# Patient Record
Sex: Female | Born: 1935 | Race: Black or African American | Hispanic: No | Smoking: Never smoker
Health system: Southern US, Community
[De-identification: ages and names within clinical notes are randomized; demographics above are authoritative.]

## PROBLEM LIST (undated history)

## (undated) DIAGNOSIS — I10 Essential (primary) hypertension: Secondary | ICD-10-CM

## (undated) DIAGNOSIS — E215 Disorder of parathyroid gland, unspecified: Secondary | ICD-10-CM

## (undated) DIAGNOSIS — I639 Cerebral infarction, unspecified: Secondary | ICD-10-CM

## (undated) DIAGNOSIS — N289 Disorder of kidney and ureter, unspecified: Secondary | ICD-10-CM

## (undated) DIAGNOSIS — C801 Malignant (primary) neoplasm, unspecified: Secondary | ICD-10-CM

## (undated) HISTORY — DX: Cerebral infarction, unspecified: I63.9

## (undated) HISTORY — DX: Disorder of parathyroid gland, unspecified: E21.5

## (undated) HISTORY — DX: Essential (primary) hypertension: I10

## (undated) HISTORY — PX: CAROTID ARTERY ANGIOPLASTY: SHX1300

---

## 1998-12-21 ENCOUNTER — Ambulatory Visit (HOSPITAL_COMMUNITY): Admission: RE | Admit: 1998-12-21 | Discharge: 1998-12-21 | Payer: Self-pay | Admitting: Ophthalmology

## 2001-09-14 ENCOUNTER — Encounter: Payer: Self-pay | Admitting: Internal Medicine

## 2001-09-14 ENCOUNTER — Encounter: Admission: RE | Admit: 2001-09-14 | Discharge: 2001-09-14 | Payer: Self-pay | Admitting: Internal Medicine

## 2002-08-08 ENCOUNTER — Encounter: Payer: Self-pay | Admitting: Obstetrics and Gynecology

## 2002-08-08 ENCOUNTER — Encounter: Admission: RE | Admit: 2002-08-08 | Discharge: 2002-08-08 | Payer: Self-pay | Admitting: Obstetrics and Gynecology

## 2002-10-07 ENCOUNTER — Encounter: Admission: RE | Admit: 2002-10-07 | Discharge: 2002-10-07 | Payer: Self-pay | Admitting: Internal Medicine

## 2002-10-07 ENCOUNTER — Encounter: Payer: Self-pay | Admitting: Internal Medicine

## 2003-10-12 ENCOUNTER — Encounter: Admission: RE | Admit: 2003-10-12 | Discharge: 2003-10-12 | Payer: Self-pay | Admitting: Internal Medicine

## 2004-11-28 ENCOUNTER — Encounter: Admission: RE | Admit: 2004-11-28 | Discharge: 2004-11-28 | Payer: Self-pay | Admitting: Internal Medicine

## 2005-12-04 ENCOUNTER — Encounter: Admission: RE | Admit: 2005-12-04 | Discharge: 2005-12-04 | Payer: Self-pay | Admitting: Internal Medicine

## 2005-12-09 ENCOUNTER — Ambulatory Visit: Payer: Self-pay | Admitting: Gastroenterology

## 2006-01-05 ENCOUNTER — Ambulatory Visit: Payer: Self-pay | Admitting: Gastroenterology

## 2006-01-16 ENCOUNTER — Ambulatory Visit: Payer: Self-pay | Admitting: Gastroenterology

## 2006-11-10 HISTORY — PX: BREAST LUMPECTOMY: SHX2

## 2006-12-14 ENCOUNTER — Encounter: Admission: RE | Admit: 2006-12-14 | Discharge: 2006-12-14 | Payer: Self-pay | Admitting: Internal Medicine

## 2006-12-30 ENCOUNTER — Encounter: Admission: RE | Admit: 2006-12-30 | Discharge: 2006-12-30 | Payer: Self-pay | Admitting: Internal Medicine

## 2007-01-11 ENCOUNTER — Encounter (INDEPENDENT_AMBULATORY_CARE_PROVIDER_SITE_OTHER): Payer: Self-pay | Admitting: Diagnostic Radiology

## 2007-01-11 ENCOUNTER — Encounter: Admission: RE | Admit: 2007-01-11 | Discharge: 2007-01-11 | Payer: Self-pay | Admitting: Internal Medicine

## 2007-01-11 ENCOUNTER — Encounter (INDEPENDENT_AMBULATORY_CARE_PROVIDER_SITE_OTHER): Payer: Self-pay | Admitting: *Deleted

## 2007-01-27 ENCOUNTER — Encounter: Admission: RE | Admit: 2007-01-27 | Discharge: 2007-01-27 | Payer: Self-pay | Admitting: Internal Medicine

## 2007-02-04 ENCOUNTER — Encounter: Admission: RE | Admit: 2007-02-04 | Discharge: 2007-02-04 | Payer: Self-pay | Admitting: General Surgery

## 2007-02-04 ENCOUNTER — Ambulatory Visit (HOSPITAL_COMMUNITY): Admission: RE | Admit: 2007-02-04 | Discharge: 2007-02-04 | Payer: Self-pay | Admitting: General Surgery

## 2007-03-15 ENCOUNTER — Encounter: Admission: RE | Admit: 2007-03-15 | Discharge: 2007-03-15 | Payer: Self-pay | Admitting: General Surgery

## 2007-03-15 ENCOUNTER — Encounter (INDEPENDENT_AMBULATORY_CARE_PROVIDER_SITE_OTHER): Payer: Self-pay | Admitting: Specialist

## 2007-03-15 ENCOUNTER — Ambulatory Visit (HOSPITAL_BASED_OUTPATIENT_CLINIC_OR_DEPARTMENT_OTHER): Admission: RE | Admit: 2007-03-15 | Discharge: 2007-03-15 | Payer: Self-pay | Admitting: General Surgery

## 2007-03-24 ENCOUNTER — Ambulatory Visit: Payer: Self-pay | Admitting: Oncology

## 2007-03-31 LAB — COMPREHENSIVE METABOLIC PANEL
ALT: 14 U/L (ref 0–35)
AST: 18 U/L (ref 0–37)
Albumin: 4.1 g/dL (ref 3.5–5.2)
Alkaline Phosphatase: 70 U/L (ref 39–117)
BUN: 18 mg/dL (ref 6–23)
CO2: 27 mEq/L (ref 19–32)
Calcium: 11 mg/dL — ABNORMAL HIGH (ref 8.4–10.5)
Chloride: 108 mEq/L (ref 96–112)
Creatinine, Ser: 0.97 mg/dL (ref 0.40–1.20)
Glucose, Bld: 100 mg/dL — ABNORMAL HIGH (ref 70–99)
Potassium: 3.8 mEq/L (ref 3.5–5.3)
Sodium: 145 mEq/L (ref 135–145)
Total Bilirubin: 0.4 mg/dL (ref 0.3–1.2)
Total Protein: 7.5 g/dL (ref 6.0–8.3)

## 2007-03-31 LAB — CBC WITH DIFFERENTIAL/PLATELET
BASO%: 0.3 % (ref 0.0–2.0)
EOS%: 1 % (ref 0.0–7.0)
HCT: 35.6 % (ref 34.8–46.6)
LYMPH%: 29.4 % (ref 14.0–48.0)
MCH: 29.7 pg (ref 26.0–34.0)
MCHC: 34.4 g/dL (ref 32.0–36.0)
MCV: 86.3 fL (ref 81.0–101.0)
MONO%: 7.3 % (ref 0.0–13.0)
NEUT%: 62 % (ref 39.6–76.8)
Platelets: 225 10*3/uL (ref 145–400)
lymph#: 1.7 10*3/uL (ref 0.9–3.3)

## 2007-06-27 ENCOUNTER — Ambulatory Visit: Payer: Self-pay | Admitting: Oncology

## 2007-07-01 LAB — CBC WITH DIFFERENTIAL/PLATELET
Basophils Absolute: 0 10*3/uL (ref 0.0–0.1)
Eosinophils Absolute: 0.1 10*3/uL (ref 0.0–0.5)
HGB: 12.2 g/dL (ref 11.6–15.9)
LYMPH%: 35.2 % (ref 14.0–48.0)
MCV: 86.5 fL (ref 81.0–101.0)
MONO#: 0.4 10*3/uL (ref 0.1–0.9)
MONO%: 9.2 % (ref 0.0–13.0)
NEUT#: 2.5 10*3/uL (ref 1.5–6.5)
Platelets: 222 10*3/uL (ref 145–400)
WBC: 4.6 10*3/uL (ref 3.9–10.0)

## 2007-10-28 ENCOUNTER — Ambulatory Visit: Payer: Self-pay | Admitting: Oncology

## 2007-11-01 LAB — CBC WITH DIFFERENTIAL/PLATELET
Eosinophils Absolute: 0.1 10*3/uL (ref 0.0–0.5)
HCT: 38.8 % (ref 34.8–46.6)
LYMPH%: 33.2 % (ref 14.0–48.0)
MCHC: 34.5 g/dL (ref 32.0–36.0)
MCV: 87.8 fL (ref 81.0–101.0)
MONO#: 0.4 10*3/uL (ref 0.1–0.9)
MONO%: 8.1 % (ref 0.0–13.0)
NEUT#: 2.8 10*3/uL (ref 1.5–6.5)
NEUT%: 57.1 % (ref 39.6–76.8)
Platelets: 218 10*3/uL (ref 145–400)
RBC: 4.42 10*6/uL (ref 3.70–5.32)

## 2007-11-01 LAB — COMPREHENSIVE METABOLIC PANEL
AST: 15 U/L (ref 0–37)
Albumin: 4.5 g/dL (ref 3.5–5.2)
BUN: 20 mg/dL (ref 6–23)
CO2: 24 mEq/L (ref 19–32)
Calcium: 11.4 mg/dL — ABNORMAL HIGH (ref 8.4–10.5)
Chloride: 106 mEq/L (ref 96–112)
Potassium: 3.7 mEq/L (ref 3.5–5.3)

## 2007-11-01 LAB — CANCER ANTIGEN 27.29: CA 27.29: 14 U/mL (ref 0–39)

## 2008-03-13 ENCOUNTER — Encounter: Admission: RE | Admit: 2008-03-13 | Discharge: 2008-03-13 | Payer: Self-pay | Admitting: Oncology

## 2008-07-20 ENCOUNTER — Ambulatory Visit: Payer: Self-pay | Admitting: Oncology

## 2008-08-01 LAB — CBC WITH DIFFERENTIAL/PLATELET
BASO%: 0.6 % (ref 0.0–2.0)
Basophils Absolute: 0 10*3/uL (ref 0.0–0.1)
EOS%: 1.2 % (ref 0.0–7.0)
Eosinophils Absolute: 0.1 10*3/uL (ref 0.0–0.5)
HCT: 38.2 % (ref 34.8–46.6)
HGB: 13 g/dL (ref 11.6–15.9)
LYMPH%: 36.6 % (ref 14.0–48.0)
MCH: 29.6 pg (ref 26.0–34.0)
MCHC: 34 g/dL (ref 32.0–36.0)
MCV: 87.2 fL (ref 81.0–101.0)
MONO#: 0.4 10*3/uL (ref 0.1–0.9)
MONO%: 8 % (ref 0.0–13.0)
NEUT#: 2.8 10*3/uL (ref 1.5–6.5)
NEUT%: 53.7 % (ref 39.6–76.8)
Platelets: 229 10*3/uL (ref 145–400)
RBC: 4.38 10*6/uL (ref 3.70–5.32)
RDW: 12.9 % (ref 11.3–14.5)
WBC: 5.1 10*3/uL (ref 3.9–10.0)
lymph#: 1.9 10*3/uL (ref 0.9–3.3)

## 2008-08-01 LAB — COMPREHENSIVE METABOLIC PANEL
ALT: 14 U/L (ref 0–35)
CO2: 26 mEq/L (ref 19–32)
Chloride: 106 mEq/L (ref 96–112)
Sodium: 145 mEq/L (ref 135–145)
Total Bilirubin: 0.6 mg/dL (ref 0.3–1.2)
Total Protein: 7.5 g/dL (ref 6.0–8.3)

## 2008-08-01 LAB — CANCER ANTIGEN 27.29: CA 27.29: 19 U/mL (ref 0–39)

## 2009-01-25 ENCOUNTER — Ambulatory Visit: Payer: Self-pay | Admitting: Oncology

## 2009-01-29 LAB — CBC WITH DIFFERENTIAL/PLATELET
BASO%: 0.3 % (ref 0.0–2.0)
Basophils Absolute: 0 10*3/uL (ref 0.0–0.1)
EOS%: 0.7 % (ref 0.0–7.0)
HGB: 12.9 g/dL (ref 11.6–15.9)
MCH: 28.2 pg (ref 25.1–34.0)
MONO%: 6.9 % (ref 0.0–14.0)
RBC: 4.58 10*6/uL (ref 3.70–5.45)
RDW: 13.8 % (ref 11.2–14.5)
lymph#: 1.4 10*3/uL (ref 0.9–3.3)

## 2009-01-30 LAB — CANCER ANTIGEN 27.29: CA 27.29: 23 U/mL (ref 0–39)

## 2009-01-30 LAB — COMPREHENSIVE METABOLIC PANEL
BUN: 17 mg/dL (ref 6–23)
CO2: 27 mEq/L (ref 19–32)
Calcium: 11.7 mg/dL — ABNORMAL HIGH (ref 8.4–10.5)
Chloride: 104 mEq/L (ref 96–112)
Creatinine, Ser: 1.11 mg/dL (ref 0.40–1.20)

## 2009-01-30 LAB — LACTATE DEHYDROGENASE: LDH: 150 U/L (ref 94–250)

## 2009-01-30 LAB — VITAMIN D 25 HYDROXY (VIT D DEFICIENCY, FRACTURES): Vit D, 25-Hydroxy: 57 ng/mL (ref 30–89)

## 2009-03-14 ENCOUNTER — Encounter: Admission: RE | Admit: 2009-03-14 | Discharge: 2009-03-14 | Payer: Self-pay | Admitting: Oncology

## 2009-04-10 HISTORY — PX: PARATHYROIDECTOMY: SHX19

## 2010-02-11 ENCOUNTER — Ambulatory Visit: Payer: Self-pay | Admitting: Oncology

## 2010-02-13 LAB — COMPREHENSIVE METABOLIC PANEL
ALT: 24 U/L (ref 0–35)
AST: 23 U/L (ref 0–37)
Albumin: 3.5 g/dL (ref 3.5–5.2)
BUN: 19 mg/dL (ref 6–23)
CO2: 30 mEq/L (ref 19–32)
Calcium: 11.3 mg/dL — ABNORMAL HIGH (ref 8.4–10.5)
Chloride: 105 mEq/L (ref 96–112)
Creatinine, Ser: 1.06 mg/dL (ref 0.40–1.20)
Potassium: 3.6 mEq/L (ref 3.5–5.3)
Total Bilirubin: 0.9 mg/dL (ref 0.3–1.2)

## 2010-02-13 LAB — CBC WITH DIFFERENTIAL/PLATELET
HGB: 11.2 g/dL — ABNORMAL LOW (ref 11.6–15.9)
MCV: 89.3 fL (ref 79.5–101.0)
MONO%: 9.2 % (ref 0.0–14.0)
NEUT#: 4.7 10*3/uL (ref 1.5–6.5)
RBC: 3.77 10*6/uL (ref 3.70–5.45)

## 2010-03-18 ENCOUNTER — Encounter: Admission: RE | Admit: 2010-03-18 | Discharge: 2010-03-18 | Payer: Self-pay | Admitting: Oncology

## 2010-09-02 ENCOUNTER — Ambulatory Visit (HOSPITAL_COMMUNITY): Admission: RE | Admit: 2010-09-02 | Discharge: 2010-09-02 | Payer: Self-pay | Admitting: Internal Medicine

## 2010-12-01 ENCOUNTER — Encounter: Payer: Self-pay | Admitting: Oncology

## 2010-12-01 ENCOUNTER — Encounter: Payer: Self-pay | Admitting: General Surgery

## 2010-12-01 ENCOUNTER — Encounter: Payer: Self-pay | Admitting: Internal Medicine

## 2011-03-13 ENCOUNTER — Other Ambulatory Visit (HOSPITAL_COMMUNITY): Payer: Self-pay | Admitting: Surgery

## 2011-03-13 ENCOUNTER — Other Ambulatory Visit: Payer: Self-pay | Admitting: Surgery

## 2011-03-13 ENCOUNTER — Ambulatory Visit (HOSPITAL_COMMUNITY)
Admission: RE | Admit: 2011-03-13 | Discharge: 2011-03-13 | Disposition: A | Discharge status: Home / Self Care | Payer: Medicare Other | Source: Ambulatory Visit | Attending: Surgery | Admitting: Surgery

## 2011-03-13 ENCOUNTER — Encounter (HOSPITAL_COMMUNITY): Payer: Medicare Other

## 2011-03-13 DIAGNOSIS — Z01810 Encounter for preprocedural cardiovascular examination: Secondary | ICD-10-CM | POA: Insufficient documentation

## 2011-03-13 DIAGNOSIS — Z01811 Encounter for preprocedural respiratory examination: Secondary | ICD-10-CM

## 2011-03-13 DIAGNOSIS — I10 Essential (primary) hypertension: Secondary | ICD-10-CM | POA: Insufficient documentation

## 2011-03-13 DIAGNOSIS — Z01818 Encounter for other preprocedural examination: Secondary | ICD-10-CM | POA: Insufficient documentation

## 2011-03-13 DIAGNOSIS — Z01812 Encounter for preprocedural laboratory examination: Secondary | ICD-10-CM | POA: Insufficient documentation

## 2011-03-13 LAB — URINALYSIS, ROUTINE W REFLEX MICROSCOPIC
Hgb urine dipstick: NEGATIVE
Protein, ur: NEGATIVE mg/dL
Specific Gravity, Urine: 1.015 (ref 1.005–1.030)

## 2011-03-13 LAB — DIFFERENTIAL
Basophils Absolute: 0 10*3/uL (ref 0.0–0.1)
Basophils Relative: 1 % (ref 0–1)
Lymphocytes Relative: 33 % (ref 12–46)
Neutrophils Relative %: 57 % (ref 43–77)

## 2011-03-13 LAB — SURGICAL PCR SCREEN

## 2011-03-13 LAB — BASIC METABOLIC PANEL
BUN: 17 mg/dL (ref 6–23)
CO2: 29 mEq/L (ref 19–32)
Calcium: 11.5 mg/dL — ABNORMAL HIGH (ref 8.4–10.5)
Chloride: 106 mEq/L (ref 96–112)
GFR calc Af Amer: >60 mL/min (ref >60)
Glucose, Bld: 99 mg/dL (ref 70–99)
Potassium: 3.9 mEq/L (ref 3.5–5.1)
Sodium: 142 mEq/L (ref 135–145)

## 2011-03-13 LAB — CBC
HCT: 35.8 % — ABNORMAL LOW (ref 36.0–46.0)
MCH: 28.8 pg (ref 26.0–34.0)
WBC: 6.1 10*3/uL (ref 4.0–10.5)

## 2011-03-16 LAB — MRSA CULTURE

## 2011-03-18 ENCOUNTER — Other Ambulatory Visit: Payer: Self-pay | Admitting: Surgery

## 2011-03-18 ENCOUNTER — Observation Stay (HOSPITAL_COMMUNITY)
Admission: RE | Admit: 2011-03-18 | Discharge: 2011-03-19 | Disposition: A | Discharge status: Home / Self Care | Payer: Medicare Other | Source: Ambulatory Visit | Attending: Surgery | Admitting: Surgery

## 2011-03-18 DIAGNOSIS — Z853 Personal history of malignant neoplasm of breast: Secondary | ICD-10-CM | POA: Insufficient documentation

## 2011-03-18 DIAGNOSIS — Z01812 Encounter for preprocedural laboratory examination: Secondary | ICD-10-CM | POA: Insufficient documentation

## 2011-03-18 DIAGNOSIS — E210 Primary hyperparathyroidism: Principal | ICD-10-CM | POA: Insufficient documentation

## 2011-03-18 DIAGNOSIS — E785 Hyperlipidemia, unspecified: Secondary | ICD-10-CM | POA: Insufficient documentation

## 2011-03-18 DIAGNOSIS — M949 Disorder of cartilage, unspecified: Secondary | ICD-10-CM | POA: Insufficient documentation

## 2011-03-18 DIAGNOSIS — M899 Disorder of bone, unspecified: Secondary | ICD-10-CM | POA: Insufficient documentation

## 2011-03-18 DIAGNOSIS — I10 Essential (primary) hypertension: Secondary | ICD-10-CM | POA: Insufficient documentation

## 2011-03-18 DIAGNOSIS — Z79899 Other long term (current) drug therapy: Secondary | ICD-10-CM | POA: Insufficient documentation

## 2011-03-25 NOTE — Op Note (Signed)
[name], [name]                  ACCOUNT NO.:  [id number]   MEDICAL RECORD NO.:  [id number]          PATIENT TYPE:  AMB   LOCATION:  DSC                          FACILITY:  [facility]   PHYSICIAN:  Ollen Gross. Vernell Morgans, M.D. DATE OF BIRTH:  1931-08-19   DATE OF PROCEDURE:  03/15/2007  DATE OF DISCHARGE:                               OPERATIVE REPORT   PREOPERATIVE DIAGNOSIS:  Left breast cancer.   POSTOPERATIVE DIAGNOSIS:  Left breast cancer.   PROCEDURES:  Left breast lumpectomy, needle-localized lumpectomy and  sentinel node biopsy with injection of blue dye.   SURGEON:  Ollen Gross. Vernell Morgans, M.D.   ANESTHESIA:  General via LMA.   PROCEDURE:  After informed consent was obtained, the patient was brought  to the operating room, placed in supine position on the operating table.  After adequate induction of general anesthesia, the patient's left  breast, chest and axillary area were prepped with Betadine and draped in  the usual sterile manner.  Earlier in the day the patient had undergone  a wire localization procedure and the wire was entering the left breast  laterally in the superior portion.  The patient also underwent a nuclear  medicine injection prior to this surgery. Two (2) cc of methylene blue  and 3 cc of injectable saline were then injected in a subareolar fashion  and the breast tissue was massaged for several minutes.  Using a  NeoProbe 2000 a single hot spot was identified in the left axilla.  A  small transverse incision was made in the axilla with a 15 blade knife.  This incision was carried down through the skin and subcutaneous tissue  sharply with the electrocautery until the axilla was entered.  Using the  NeoProbe to direct the dissection, blunt dissection was carried out in  the axilla with a hemostat until 2 areas of blue lymph node were  identified.  Each of these areas were hot using the NeoProbe.  Each of  these two areas were dissected free using a combination  of sharp  dissection with the electrocautery.  The lymphatics at each lymph node  were then clamped with hemostats, divided and ligated with 3-0 Vicryl  ties.  No other hot spots were identified.  The ex vivo counts on  sentinel node #1 were approximately 1000 and on #2 were approximately  320.  These were sent as sentinel node #1 and 2.  The rest of the axilla  was examined.  No other hot spots were identified.  No other palpable  nodes were apparent.  The wound was hemostatic.  The deep layer of the  incision was then closed with interrupted 3-0 Vicryl stitches and the  skin was closed with a running 4-0 Monocryl subcuticular stitch.  Attention was then turned to the left breast.  A curvilinear incision  was made in the upper outer aspect of the left breast to incorporate the  wire.  This incision was made with a 15 blade knife.  This incision was  carried down through the skin and subcutaneous tissue into the breast  tissue sharply with the electrocautery.  A circular piece of breast  tissue was then excised around the path of the wire.  This was done  sharply with the electrocautery.  Once the specimen was removed it was  oriented with the short stitch being superior and the long stitch being  lateral.  The dissection was carried from the skin down to the chest  wall.  Touch preps on the lymph nodes were negative.  Touch preps on the  margins of the specimen were negative.  The wound was then irrigated  with copious amounts of saline.  Hemostasis was achieved using the Bovie  electrocautery.  Both wounds were then infiltrated with 0.25% Marcaine.  The deep layer of the breast wound was then closed with interrupted 3-0  Vicryl stitches and the skin was closed with a running 4-0 Monocryl  subcuticular stitch.  Benzoin, Steri-Strips and sterile dressings were  applied.  The patient tolerated the procedure well.  At the end of the  case, all needle, sponge and instrument counts were  correct.  The  patient was then awakened and taken to recovery in stable condition.      Ollen Gross. Vernell Morgans, M.D.  Electronically Signed     PST/MEDQ  D:  03/15/2007  T:  03/15/2007  Job:  [id number]

## 2011-03-25 NOTE — Op Note (Signed)
[name], [name]                  ACCOUNT NO.:  [id number]  MEDICAL RECORD NO.:  [id number]           PATIENT TYPE:  O  LOCATION:  DAYL                         FACILITY:  [facility]  PHYSICIAN:  Velora Heckler, MD      DATE OF BIRTH:  [birth year]  DATE OF PROCEDURE:  03/18/2011                               OPERATIVE REPORT   PREOPERATIVE DIAGNOSIS:  Primary hyperparathyroidism.  POSTOPERATIVE DIAGNOSIS:  Primary hyperparathyroidism.  PROCEDURE:  Right inferior minimally invasive parathyroidectomy.  SURGEON:  Velora Heckler, MD, FACS  ANESTHESIA:  General.  ESTIMATED BLOOD LOSS:  Minimal.  PREPARATION:  ChloraPrep.  COMPLICATIONS:  None.  INDICATIONS:  The patient is a 77 year old black female from [place], [place].  She is referred by Dr. Guerry Bruin for primary hyperparathyroidism.  The patient has had serum calcium levels running as high as 12.7.  An intact PTH level was normal at 37.  A nuclear medicine parathyroid scan from October 2011, however, showed evidence of activity localizing in the inferior right thyroid lobe consistent with parathyroid adenoma.  The patient is referred to surgery for evaluation. The patient had had a previous neck surgery at [facility] approximately 18 years ago.  Records were not available.  It is presumed this may have been a previous operation for primary hyperparathyroidism. The patient now comes to the operating room for minimally invasive parathyroidectomy.  PROCEDURE IN DETAIL:  Procedure was done in OR #2 at the [facility].  The patient was brought to the operating room, placed in supine position on the operating room table.  Following administration of general anesthesia, the patient is positioned and then prepped and draped in the usual strict aseptic fashion.  After ascertaining that an adequate level of anesthesia had been achieved, a right neck incision is made through the previous scar  using a #15 blade. Dissection was carried through subcutaneous tissues, scar tissue, and the platysma.  Hemostasis was obtained with electrocautery.  Skin flaps were elevated circumferentially and a Weitlaner retractor placed for exposure.  Strap muscles were incised in the midline and reflected to the right.  The inferior pole of the right thyroid lobe was exposed. Muscles were somewhat adherent to the thyroid tissue and are divided with electrocautery.  Right lobe was gently mobilized.  There appears to be a nodular density just posterior and lateral to the inferior right thyroid lobe.  This may well represent abnormally enlarged parathyroid gland.  It is gently dissected out away from the underlying carotid artery.  It was separated from the thyroid parenchyma using the electrocautery.  Venous tributaries were divided between Ligaclips.  The entire density is excised.  It measures approximately 1.5 cm in size. It is submitted fresh to pathology where frozen section biopsy by Dr. Hollice Espy was performed.  Dr. Dierdre Searles confirmed parathyroid tissue which was hyperplastic and consistent with parathyroid adenoma.  She estimated the weight at 1000 mg.  Good hemostasis was obtained throughout the wound.  Surgicel was placed in the operative field.  Strap muscles were reapproximated in the midline with interrupted 3-0 Vicryl sutures.  Platysma was closed with interrupted 3-0 Vicryl sutures.  Skin was anesthetized with local anesthetic.  Skin edges were reapproximated with a running 4-0 Monocryl subcuticular suture.  Wound is washed and dried and Benzoin and Steri- Strips were applied.  Sterile dressings were applied.  The patient is awakened from anesthesia and brought to the recovery room.  The patient tolerated the procedure well.   Velora Heckler, MD, FACS     TMG/MEDQ  D:  03/18/2011  T:  03/18/2011  Job:  [id number]  cc:   Gaspar Garbe, M.D. Fax: [phone]  Electronically Signed  by Darnell Level MD on 03/25/2011 12:06:10 PM

## 2011-03-25 NOTE — Discharge Summary (Signed)
  [name], [name]                  ACCOUNT NO.:  [id number]  MEDICAL RECORD NO.:  [id number]           PATIENT TYPE:  O  LOCATION:  1305                         FACILITY:  [facility]  PHYSICIAN:  Velora Heckler, MD      DATE OF BIRTH:  Apr 30, 1931  DATE OF ADMISSION:  03/18/2011 DATE OF DISCHARGE:  03/19/2011                              DISCHARGE SUMMARY   PREOPERATIVE DIAGNOSIS:  Primary hyperparathyroidism, recurrent.  POSTOPERATIVE DIAGNOSIS:  Primary hyperparathyroidism, recurrent.  BRIEF HISTORY:  The patient is a 77 year old black female from [place], [place].  She was found by Dr. Guerry Bruin to have elevated serum calcium levels.  Recent level was elevated at 12.7. The patient underwent nuclear medicine parathyroid scan in October 2011 which localized an inferior right parathyroid adenoma.  The patient noted to have previous exploration of the neck 18 years ago at [facility].  The patient now returns to the operating room for parathyroidectomy.  HOSPITAL COURSE:  The patient was admitted on May 8th.  She was taken directly to the operating room where she underwent neck exploration and right inferior parathyroidectomy.  Postoperative course was unremarkable.  Serum calcium levels fell from 12.7 to 11.2 to 10.1 on the first postoperative day.  The patient was prepared for discharge.  DISCHARGE PLANNING:  The patient is discharged home today, Mar 19, 2011, in good condition, tolerating a regular diet, and ambulating independently.  DISCHARGE MEDICATIONS:  Vicodin as needed for pain.  The patient will return to my office in 2-3 weeks.  We will check a calcium level prior to her office visit.  FINAL DIAGNOSIS:  Primary hyperparathyroidism, final pathologic results pending at the time of discharge.  Condition at discharge is good.     Velora Heckler, MD     TMG/MEDQ  D:  03/19/2011  T:  03/19/2011  Job:  [id number]  cc:   Gaspar Garbe,  M.D. Fax: [phone]  Electronically Signed by Darnell Level MD on 03/25/2011 12:06:15 PM

## 2011-04-08 ENCOUNTER — Other Ambulatory Visit: Payer: Self-pay | Admitting: Internal Medicine

## 2011-04-08 DIAGNOSIS — Z9889 Other specified postprocedural states: Secondary | ICD-10-CM

## 2011-04-08 DIAGNOSIS — Z853 Personal history of malignant neoplasm of breast: Secondary | ICD-10-CM

## 2011-04-10 ENCOUNTER — Other Ambulatory Visit: Payer: Self-pay | Admitting: Oncology

## 2011-04-10 ENCOUNTER — Encounter (HOSPITAL_BASED_OUTPATIENT_CLINIC_OR_DEPARTMENT_OTHER): Payer: Medicare Other | Admitting: Oncology

## 2011-04-10 DIAGNOSIS — C50419 Malignant neoplasm of upper-outer quadrant of unspecified female breast: Secondary | ICD-10-CM

## 2011-04-10 DIAGNOSIS — Z170 Estrogen receptor positive status [ER+]: Secondary | ICD-10-CM

## 2011-04-10 LAB — CBC WITH DIFFERENTIAL/PLATELET
BASO%: 0.4 % (ref 0.0–2.0)
EOS%: 2.3 % (ref 0.0–7.0)
HCT: 32.5 % — ABNORMAL LOW (ref 34.8–46.6)
MCH: 29.6 pg (ref 25.1–34.0)
MCHC: 33.6 g/dL (ref 31.5–36.0)
MCV: 87.9 fL (ref 79.5–101.0)
MONO%: 9.1 % (ref 0.0–14.0)
NEUT%: 59.8 % (ref 38.4–76.8)
RDW: 13.7 % (ref 11.2–14.5)
lymph#: 1.8 10*3/uL (ref 0.9–3.3)

## 2011-04-11 LAB — PTH, INTACT AND CALCIUM
Calcium, Total (PTH): 9.6 mg/dL (ref 8.4–10.5)
PTH: 15.5 pg/mL (ref 14.0–72.0)

## 2011-04-11 LAB — COMPREHENSIVE METABOLIC PANEL
ALT: 14 U/L (ref 0–35)
Albumin: 3.8 g/dL (ref 3.5–5.2)
CO2: 25 mEq/L (ref 19–32)
Chloride: 107 mEq/L (ref 96–112)
Glucose, Bld: 83 mg/dL (ref 70–99)
Potassium: 3.7 mEq/L (ref 3.5–5.3)
Sodium: 143 mEq/L (ref 135–145)
Total Protein: 7 g/dL (ref 6.0–8.3)

## 2011-04-17 ENCOUNTER — Ambulatory Visit
Admission: RE | Admit: 2011-04-17 | Discharge: 2011-04-17 | Disposition: A | Discharge status: Home / Self Care | Payer: Medicare Other | Source: Ambulatory Visit | Attending: Internal Medicine | Admitting: Internal Medicine

## 2011-04-17 DIAGNOSIS — Z853 Personal history of malignant neoplasm of breast: Secondary | ICD-10-CM

## 2011-04-30 ENCOUNTER — Encounter (INDEPENDENT_AMBULATORY_CARE_PROVIDER_SITE_OTHER): Payer: Self-pay | Admitting: Surgery

## 2011-04-30 DIAGNOSIS — I10 Essential (primary) hypertension: Secondary | ICD-10-CM | POA: Insufficient documentation

## 2011-04-30 DIAGNOSIS — H269 Unspecified cataract: Secondary | ICD-10-CM | POA: Insufficient documentation

## 2011-04-30 DIAGNOSIS — C50919 Malignant neoplasm of unspecified site of unspecified female breast: Secondary | ICD-10-CM

## 2011-04-30 DIAGNOSIS — K069 Disorder of gingiva and edentulous alveolar ridge, unspecified: Secondary | ICD-10-CM | POA: Insufficient documentation

## 2011-04-30 DIAGNOSIS — C50412 Malignant neoplasm of upper-outer quadrant of left female breast: Secondary | ICD-10-CM | POA: Insufficient documentation

## 2011-04-30 DIAGNOSIS — Z973 Presence of spectacles and contact lenses: Secondary | ICD-10-CM

## 2011-05-21 ENCOUNTER — Ambulatory Visit (INDEPENDENT_AMBULATORY_CARE_PROVIDER_SITE_OTHER): Payer: Medicare Other | Admitting: Surgery

## 2011-05-21 ENCOUNTER — Encounter (INDEPENDENT_AMBULATORY_CARE_PROVIDER_SITE_OTHER): Payer: Self-pay | Admitting: Surgery

## 2011-05-21 VITALS — BP 130/64 | HR 66 | Temp 97.0°F | Wt 110.6 lb

## 2011-05-21 DIAGNOSIS — E210 Primary hyperparathyroidism: Secondary | ICD-10-CM | POA: Insufficient documentation

## 2011-05-21 NOTE — Progress Notes (Signed)
HISTORY: The patient returns for followup having undergone minimally invasive parathyroidectomy in May 2012. Final laboratory studies drawn on May 19, 2011 show a normal serum calcium level of 9.3 and a normal intact PTH level of 35.8.   PERTINENT REVIEW OF SYSTEMS: The patient notes increased energy levels. She also notes improvement in bone and joint discomfort.   EXAM: Surgical wound is well healed. No edema. No sign of infection. Good cosmetic result.   IMPRESSION: Status post minimally invasive parathyroidectomy with normalization of serum calcium level and parathyroid hormone level and   PLAN: Patient will return as needed.

## 2011-07-28 ENCOUNTER — Encounter (INDEPENDENT_AMBULATORY_CARE_PROVIDER_SITE_OTHER): Payer: Self-pay | Admitting: General Surgery

## 2011-09-23 ENCOUNTER — Encounter (INDEPENDENT_AMBULATORY_CARE_PROVIDER_SITE_OTHER): Payer: Self-pay | Admitting: General Surgery

## 2011-09-23 ENCOUNTER — Ambulatory Visit (INDEPENDENT_AMBULATORY_CARE_PROVIDER_SITE_OTHER): Payer: Medicaid Other | Admitting: General Surgery

## 2011-09-23 VITALS — BP 102/76 | HR 60 | Temp 98.0°F | Resp 16 | Ht 63.0 in | Wt 115.2 lb

## 2011-09-23 DIAGNOSIS — C50919 Malignant neoplasm of unspecified site of unspecified female breast: Secondary | ICD-10-CM

## 2011-09-23 NOTE — Patient Instructions (Signed)
Continue regular self exams  

## 2011-09-24 ENCOUNTER — Encounter (INDEPENDENT_AMBULATORY_CARE_PROVIDER_SITE_OTHER): Payer: Self-pay | Admitting: General Surgery

## 2011-09-24 NOTE — Progress Notes (Signed)
Subjective:     Patient ID: [name], female   DOB: October 31, 1931, 77 y.o.   MRN: [id number]  HPI The patient is a 77 year old black female who is 4-1/2 years out from a left breast lumpectomy and negative sentinel node biopsy for a T1 A. N0 left breast cancer. She was ER PR positive. Since her last visit she's had no complaints. Her appetite is good and her bowels are working normally.  Review of Systems  Constitutional: Negative.   HENT: Negative.   Eyes: Negative.   Respiratory: Negative.   Cardiovascular: Negative.   Gastrointestinal: Negative.   Genitourinary: Negative.   Musculoskeletal: Negative.   Skin: Negative.   Neurological: Negative.   Hematological: Negative.   Psychiatric/Behavioral: Negative.        Objective:   Physical Exam  Constitutional: She is oriented to person, place, and time. She appears well-developed and well-nourished.  HENT:  Head: Normocephalic and atraumatic.  Eyes: Conjunctivae and EOM are normal. Pupils are equal, round, and reactive to light.  Neck: Normal range of motion. Neck supple.  Cardiovascular: Normal rate, regular rhythm and normal heart sounds.   Pulmonary/Chest: Effort normal and breath sounds normal.       There is no distinct palpable mass in either breast. No axillary supraclavicular or cervical lymphadenopathy.  Abdominal: Soft. Bowel sounds are normal. She exhibits no mass. There is no tenderness.  Musculoskeletal: Normal range of motion.  Lymphadenopathy:    She has no cervical adenopathy.  Neurological: She is alert and oriented to person, place, and time.  Skin: Skin is warm and dry.  Psychiatric: She has a normal mood and affect. Her behavior is normal.       Assessment:     4 1/2 years out from a left breast lumpectomy and negative sentinel node biopsy    Plan:     At this point she seems to be doing well. She has scheduled followup with Dr. Darnelle Catalan the next several months. Her last mammogram showed no evidence  of malignancy. I have encouraged her to continue regular self exams. We will plan to see her back in one year.

## 2012-03-22 ENCOUNTER — Other Ambulatory Visit: Payer: Self-pay | Admitting: Internal Medicine

## 2012-03-22 DIAGNOSIS — Z853 Personal history of malignant neoplasm of breast: Secondary | ICD-10-CM

## 2012-04-06 ENCOUNTER — Other Ambulatory Visit: Payer: Medicare Other | Admitting: Lab

## 2012-04-07 ENCOUNTER — Telehealth: Payer: Self-pay | Admitting: Oncology

## 2012-04-07 NOTE — Telephone Encounter (Signed)
S/w the pt and she is aware of her appt on 04/12/2012 with dr Darnelle Catalan

## 2012-04-12 ENCOUNTER — Ambulatory Visit (HOSPITAL_BASED_OUTPATIENT_CLINIC_OR_DEPARTMENT_OTHER): Payer: Medicare Other | Admitting: Oncology

## 2012-04-12 VITALS — BP 138/73 | HR 75 | Temp 98.5°F | Ht 63.0 in | Wt 118.5 lb

## 2012-04-12 DIAGNOSIS — C50919 Malignant neoplasm of unspecified site of unspecified female breast: Secondary | ICD-10-CM

## 2012-04-12 DIAGNOSIS — Z170 Estrogen receptor positive status [ER+]: Secondary | ICD-10-CM

## 2012-04-12 DIAGNOSIS — C50419 Malignant neoplasm of upper-outer quadrant of unspecified female breast: Secondary | ICD-10-CM

## 2012-04-12 NOTE — Progress Notes (Signed)
ID: [name]   DOB: June 26, 1931  MR#: [id number]  WUJ#:[id number]  HISTORY OF PRESENT ILLNESS: The patient has yearly mammograms routinely and mammography in January of 2006 had been unremarkable.  On 12/14/2006, mammography showed new calcifications in the left breast, and the patient was called back for a diagnostic study with ultrasonography on 12/20/2006.  This showed a small lobular mass with calcifications in the 1 o'clock position within the left breast, which by ultrasound measured up to 5 mm in size.  Accordingly, the patient was called back for ultrasound-guided biopsy 01/11/2007 and this showed ([id number] and [id number]) an invasive ductal carcinoma, which was strongly ER positive at 94%, weakly PR positive at 8%, with a borderline proliferation marker at 19% and negative for HER-2 by the HercepTest at 0.   On 01/27/2007, she underwent bilateral breast MRIs.  This showed multiple foci of enhancement bilaterally, but particularly a 5-mm nodule in the upper outer quadrant of the left breast correlated with the mammographic abnormality otherwise noted.  There was no other dominant nodule or suspicious enhancement noted in either breast.    With this information, the patient proceeded to left needle localization lumpectomy and sentinel lymph node biopsy on 03/15/2007.  The final pathology there ([id number]) showed a 3-mm invasive ductal carcinoma, which was grade 2, with no evidence of lymphovascular invasion.  Margins were negative, although close at 1 mm, both for the invasive and the noninvasive components.  Both of the sentinel lymph nodes were clear.  Her subsequent history is as detailed below.  INTERVAL HISTORY: [name] returns today for routine followup of her breast cancer. The interval history is unremarkable. Family is doing "fine", she reports no side effects from her anastrozole, and when asked what her worse problem is she tells me she does not have a "worst problem"  REVIEW OF  SYSTEMS: She denies unusual headaches, visual changes, chest pain or pressure, cough, phlegm production, pleurisy, change in bowel or bladder habits, pain, fever, bleeding, rash, or unexplained fatigue or weight loss. A detailed review of systems today was negative  PAST MEDICAL HISTORY: Past Medical History  Diagnosis Date  . Hypertension   . Cataract   . Parathyroid disease     Clarify with patient. No detail indicated on history form.  . Thyroid disease     PAST SURGICAL HISTORY: Past Surgical History  Procedure Date  . Breast lumpectomy     Confirm type with patient. Not indicated on medical history form dated 12/26/10.  . Parathyroidectomy June 2010    FAMILY HISTORY Family History  Problem Relation Age of Onset  . Stroke Mother   . Stroke Father   The patient's father died at the age of 76 from a stroke.  The patient's mother died at the age of 39 from a stroke.  The patient had 9 siblings--3 brothers and 6 sisters.  There is no history of breast cancer in the family.  Two sisters died from "cancer in the stomach", by which the patient actually means the abdomen.  It is not possible to ascertain whether this was ovarian cancer or some other cancer metastatic to the abdominal area.    GYNECOLOGIC HISTORY: The patient is GX P11, first pregnancy age 49, last pregnancy age 70.  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  She is widowed--her husband died in 04/22/1991 from multiple complications of diabetes and high blood pressure.  A grandson, Mardee Postin, lives behind the house and is frequently with her. Mostly, the patient lives by herself.  She does drive.  She is a member of [employer].   ADVANCED DIRECTIVES:  HEALTH MAINTENANCE: History  Substance Use Topics  . Smoking status: Former  Smoker    Types: Cigarettes  . Smokeless tobacco: Never Used   Comment: "When using the bathroom"  . Alcohol Use: Yes     Glass of wine on holidays.     Colonoscopy:  PAP:  Bone density:  Lipid panel:  No Known Allergies  Current Outpatient Prescriptions  Medication Sig Dispense Refill  . anastrozole (ARIMIDEX) 1 MG tablet Take 1 mg by mouth daily.        . Cholecalciferol ([employer] VIT D 5000 HIGH-POTENCY PO) Take by mouth.        . felodipine (PLENDIL) 2.5 MG 24 hr tablet Take 10 mg by mouth daily. Very medication name, dosage and frequency. Not indicated on history form.      . ferrous fumarate (FERRO-SEQUELS) 50 MG CR tablet Take 65 mg by mouth daily. Verify type, dosage and frequency with patient. Not indicated on history form.      . ibandronate (BONIVA) 150 MG tablet Take 150 mg by mouth every 30 (thirty) days. Take in the morning with a full glass of water, on an empty stomach, and do not take anything else by mouth or lie down for the next 30 min.       . metoprolol (LOPRESSOR) 50 MG tablet Take 25 mg by mouth 2 (two) times daily. Verify dosage/frequency with patient. Not indicated on history form.      . potassium chloride (KLOR-CON) 8 MEQ tablet Take 20 mEq by mouth daily. Verify type, dosage and frequency. Not indicated on history form.        OBJECTIVE: Elderly African American woman in no acute distress Filed Vitals:   04/12/12 0949  BP: 138/73  Pulse: 75  Temp: 98.5 F (36.9 C)     Body mass index is 20.99 kg/(m^2).    ECOG FS: 1  Sclerae unicteric Oropharynx clear No peripheral adenopathy Lungs no rales or rhonchi Heart regular rate and rhythm Abd benign MSK no focal spinal tenderness, no peripheral edema Neuro: nonfocal Breasts: Right breast is unremarkable. The left breast is status post lumpectomy. There is no evidence of local recurrence  LAB RESULTS: Lab Results  Component Value Date   WBC 6.3 04/10/2011   WBC 6.3 04/10/2011   NEUTROABS 3.7 04/10/2011    NEUTROABS 3.7 04/10/2011   HGB 10.9* 04/10/2011   HGB 10.9* 04/10/2011   HCT 32.5* 04/10/2011   HCT 32.5* 04/10/2011   MCV 87.9 04/10/2011   MCV 87.9 04/10/2011   PLT 252 04/10/2011   PLT 252 04/10/2011      Chemistry      Component Value Date/Time   NA 143 04/10/2011 1042   K 3.7 04/10/2011 1042   CL 107 04/10/2011 1042   CO2 25 04/10/2011 1042   BUN 16 04/10/2011 1042   CREATININE 1.00 04/10/2011 1042      Component Value Date/Time   CALCIUM 9.6 04/10/2011 1042   CALCIUM 9.6 04/10/2011 1042   ALKPHOS 66 04/10/2011 1042   AST 15 04/10/2011 1042   ALT 14 04/10/2011 1042   BILITOT 0.4 04/10/2011 1042       Lab Results  Component Value Date   LABCA2 23 01/29/2009    No components found with this basename: [id number]    No results found for this basename: INR:1;PROTIME:1 in the last 168 hours  Urinalysis    Component Value Date/Time   COLORURINE YELLOW 03/13/2011 1230   APPEARANCEUR CLEAR 03/13/2011 1230   LABSPEC 1.015 03/13/2011 1230   PHURINE 6.5 03/13/2011 1230   GLUCOSEU NEGATIVE 03/13/2011 1230   HGBUR NEGATIVE 03/13/2011 1230   BILIRUBINUR NEGATIVE 03/13/2011 1230   KETONESUR NEGATIVE 03/13/2011 1230   PROTEINUR NEGATIVE 03/13/2011 1230   UROBILINOGEN 0.2 03/13/2011 1230   NITRITE NEGATIVE 03/13/2011 1230   LEUKOCYTESUR NEGATIVE MICROSCOPIC NOT DONE ON URINES WITH NEGATIVE PROTEIN, BLOOD, LEUKOCYTES, NITRITE, OR GLUCOSE <1000 mg/dL. 03/13/2011 1230    STUDIES: No new results found. Mammography is a ready scheduled for later this month  ASSESSMENT: A 78 year old [place] woman   (1) status post left lumpectomy and sentinel lymph node dissection May 2008 for a T1a N0 grade 2 estrogen and progesterone receptor positive HER-2 negative invasive ductal carcinoma, did not receive  radiation, on Arimidex since June 2008 with excellent tolerance  (2) removal of a parathyroid adenoma under Dr. Gerrit Friends, Mar 18, 2011 (pathology [phone])  PLAN: She has completed 5 years of an aromatase inhibitor. At  this point I feel comfortable releasing her back to her primary care physician. All she will need in terms of breast cancer followup is yearly mammography and yearly physician breast exam. She understands that we keep her records for 10 years and any problems or concerns that may develop in the future I will be glad to troubleshoot with her or her other doctors. As of now however no further appointments are being made for her here.   Saphira Lahmann C    04/12/2012

## 2012-04-19 ENCOUNTER — Ambulatory Visit
Admission: RE | Admit: 2012-04-19 | Discharge: 2012-04-19 | Disposition: A | Discharge status: Home / Self Care | Payer: Medicare Other | Source: Ambulatory Visit | Attending: Internal Medicine | Admitting: Internal Medicine

## 2012-04-19 DIAGNOSIS — Z853 Personal history of malignant neoplasm of breast: Secondary | ICD-10-CM

## 2013-03-30 ENCOUNTER — Other Ambulatory Visit: Payer: Self-pay | Admitting: Internal Medicine

## 2013-03-30 DIAGNOSIS — Z853 Personal history of malignant neoplasm of breast: Secondary | ICD-10-CM

## 2013-04-29 ENCOUNTER — Ambulatory Visit
Admission: RE | Admit: 2013-04-29 | Discharge: 2013-04-29 | Disposition: A | Discharge status: Home / Self Care | Payer: Medicare Other | Source: Ambulatory Visit | Attending: Internal Medicine | Admitting: Internal Medicine

## 2013-04-29 DIAGNOSIS — Z853 Personal history of malignant neoplasm of breast: Secondary | ICD-10-CM

## 2014-03-29 ENCOUNTER — Other Ambulatory Visit: Payer: Self-pay | Admitting: Internal Medicine

## 2014-03-29 DIAGNOSIS — Z853 Personal history of malignant neoplasm of breast: Secondary | ICD-10-CM

## 2014-05-01 ENCOUNTER — Ambulatory Visit
Admission: RE | Admit: 2014-05-01 | Discharge: 2014-05-01 | Disposition: A | Discharge status: Home / Self Care | Payer: Medicare Other | Source: Ambulatory Visit | Attending: Internal Medicine | Admitting: Internal Medicine

## 2014-05-01 DIAGNOSIS — Z853 Personal history of malignant neoplasm of breast: Secondary | ICD-10-CM

## 2014-11-05 ENCOUNTER — Emergency Department (INDEPENDENT_AMBULATORY_CARE_PROVIDER_SITE_OTHER)
Admission: EM | Admit: 2014-11-05 | Discharge: 2014-11-05 | Disposition: A | Discharge status: Home / Self Care | Payer: Medicare Other | Source: Home / Self Care | Attending: Family Medicine | Admitting: Family Medicine

## 2014-11-05 ENCOUNTER — Encounter (HOSPITAL_COMMUNITY): Payer: Self-pay | Admitting: *Deleted

## 2014-11-05 DIAGNOSIS — L03115 Cellulitis of right lower limb: Secondary | ICD-10-CM

## 2014-11-05 HISTORY — DX: Malignant (primary) neoplasm, unspecified: C80.1

## 2014-11-05 MED ORDER — DOXYCYCLINE HYCLATE 100 MG PO CAPS: 100.0000 mg | ORAL_CAPSULE | Freq: Two times a day (BID) | ORAL | Status: DC

## 2014-11-05 NOTE — Discharge Instructions (Signed)

## 2014-11-05 NOTE — ED Provider Notes (Signed)
CSN: [id number]     Arrival date & time 11/05/14  1648 History   First MD Initiated Contact with Patient 11/05/14 1708     Chief Complaint  Patient presents with  . Abscess   (Consider location/radiation/quality/duration/timing/severity/associated sxs/prior Treatment) HPI Comments: Reports she developed a "sore" at her right lateral lower leg about 3 weeks ago that she has been treating at home with local wound care and hydrogen peroxide. States this sore has nearly healed. States that about one week ago she developed an additional similar sore just proximal to original lesion. She has also performed wound care to this area, however, one of her children noticed lesion and asked that she have area evaluated by a medical provider.  States she has developed some mild redness and swelling around area over past 24-48 hours without associated drainage, fever, chills.    Patient is a 80 y.o. female presenting with abscess. The history is provided by the patient.  Abscess   Past Medical History  Diagnosis Date  . Hypertension   . Cataract   . Parathyroid disease     Clarify with patient. No detail indicated on history form.  . Thyroid disease   . Cancer     breast   Past Surgical History  Procedure Laterality Date  . Parathyroidectomy  June 2010  . Breast lumpectomy  2008    Confirm type with patient. Not indicated on medical history form dated 12/26/10.   Family History  Problem Relation Age of Onset  . Stroke Mother   . Hypertension Mother   . Stroke Father    History  Substance Use Topics  . Smoking status: Passive Smoke Exposure - Never Smoker  . Smokeless tobacco: Never Used     Comment: "When using the bathroom"  . Alcohol Use: Yes     Comment: Glass of wine on holidays.   OB History    No data available     Review of Systems  All other systems reviewed and are negative.   Allergies  Review of patient's allergies indicates no known allergies.  Home Medications     Prior to Admission medications   Medication Sig Start Date End Date Taking? Authorizing Provider  Cholecalciferol ([employer] VIT D 5000 HIGH-POTENCY PO) Take by mouth.     Yes Historical Provider, MD  felodipine (PLENDIL) 2.5 MG 24 hr tablet Take 10 mg by mouth daily. Very medication name, dosage and frequency. Not indicated on history form.   Yes Historical Provider, MD  ferrous sulfate 325 (65 FE) MG tablet Take 325 mg by mouth daily with breakfast.   Yes Historical Provider, MD  ibandronate (BONIVA) 150 MG tablet Take 150 mg by mouth every 30 (thirty) days. Take in the morning with a full glass of water, on an empty stomach, and do not take anything else by mouth or lie down for the next 30 min.    Yes Historical Provider, MD  losartan (COZAAR) 25 MG tablet Take 25 mg by mouth daily.   Yes Historical Provider, MD  metoprolol (LOPRESSOR) 50 MG tablet Take 25 mg by mouth 2 (two) times daily. Verify dosage/frequency with patient. Not indicated on history form.   Yes Historical Provider, MD  potassium chloride SA (K-DUR,KLOR-CON) 20 MEQ tablet Take 20 mEq by mouth 2 (two) times daily.   Yes Historical Provider, MD  rosuvastatin (CRESTOR) 20 MG tablet Take 20 mg by mouth daily. Cholesterol med- not sure of name or dose.   Yes Historical Provider, MD  anastrozole (ARIMIDEX) 1 MG tablet Take 1 mg by mouth daily.      Historical Provider, MD  doxycycline (VIBRAMYCIN) 100 MG capsule Take 1 capsule (100 mg total) by mouth 2 (two) times daily. 11/05/14   Audelia Hives Rosabel Sermeno, PA  ferrous fumarate (FERRO-SEQUELS) 50 MG CR tablet Take 65 mg by mouth daily. Verify type, dosage and frequency with patient. Not indicated on history form.    Historical Provider, MD  potassium chloride (KLOR-CON) 8 MEQ tablet Take 20 mEq by mouth daily. Verify type, dosage and frequency. Not indicated on history form.    Historical Provider, MD   BP 193/88 mmHg  Pulse 81  Temp(Src) 98.2 F (36.8 C) (Oral)  Resp 16  SpO2  99% Physical Exam  Constitutional: She is oriented to person, place, and time. She appears well-developed and well-nourished. No distress.  HENT:  Head: Normocephalic and atraumatic.  Eyes: Conjunctivae are normal.  Cardiovascular: Normal rate.   Pulmonary/Chest: Effort normal.  Musculoskeletal: Normal range of motion.       Legs: Neurological: She is alert and oriented to person, place, and time.  Skin: Skin is warm and dry. There is erythema.  Psychiatric: She has a normal mood and affect. Her behavior is normal.  Nursing note and vitals reviewed.   ED Course  Procedures (including critical care time) Labs Review Labs Reviewed - No data to display  Imaging Review No results found.   MDM   1. Cellulitis of right lower leg   No clinical indication of abscess. Continue local wound care, elevation and begin doxycycline as prescribed. Advised 2-3 day follow up with her PCP for wound re-check.     Lutricia Feil, [place] 11/05/14 [phone]

## 2014-11-05 NOTE — ED Notes (Addendum)
Abscess R lower leg 3 weeks ago. It healed up with H202 and vaseline.  Another came up last week about 2-3 " above the other one.  Started as a small fluid filled bump. No hx. of MRSA.

## 2015-03-06 ENCOUNTER — Inpatient Hospital Stay (HOSPITAL_COMMUNITY)
Admission: EM | Admit: 2015-03-06 | Discharge: 2015-03-09 | DRG: 065 | Disposition: A | Discharge status: Home / Self Care | Payer: Medicare Other | Attending: Internal Medicine | Admitting: Internal Medicine

## 2015-03-06 ENCOUNTER — Emergency Department (HOSPITAL_COMMUNITY): Payer: Medicare Other

## 2015-03-06 ENCOUNTER — Encounter (HOSPITAL_COMMUNITY): Payer: Self-pay | Admitting: Emergency Medicine

## 2015-03-06 DIAGNOSIS — C50412 Malignant neoplasm of upper-outer quadrant of left female breast: Secondary | ICD-10-CM | POA: Diagnosis present

## 2015-03-06 DIAGNOSIS — Z7722 Contact with and (suspected) exposure to environmental tobacco smoke (acute) (chronic): Secondary | ICD-10-CM | POA: Diagnosis present

## 2015-03-06 DIAGNOSIS — C50919 Malignant neoplasm of unspecified site of unspecified female breast: Secondary | ICD-10-CM | POA: Diagnosis not present

## 2015-03-06 DIAGNOSIS — G459 Transient cerebral ischemic attack, unspecified: Secondary | ICD-10-CM | POA: Diagnosis not present

## 2015-03-06 DIAGNOSIS — H540 Blindness, both eyes: Secondary | ICD-10-CM | POA: Diagnosis present

## 2015-03-06 DIAGNOSIS — I6389 Other cerebral infarction: Secondary | ICD-10-CM

## 2015-03-06 DIAGNOSIS — I6521 Occlusion and stenosis of right carotid artery: Secondary | ICD-10-CM | POA: Diagnosis not present

## 2015-03-06 DIAGNOSIS — I6529 Occlusion and stenosis of unspecified carotid artery: Secondary | ICD-10-CM | POA: Diagnosis present

## 2015-03-06 DIAGNOSIS — Z7982 Long term (current) use of aspirin: Secondary | ICD-10-CM | POA: Diagnosis not present

## 2015-03-06 DIAGNOSIS — E785 Hyperlipidemia, unspecified: Secondary | ICD-10-CM | POA: Diagnosis present

## 2015-03-06 DIAGNOSIS — Z853 Personal history of malignant neoplasm of breast: Secondary | ICD-10-CM

## 2015-03-06 DIAGNOSIS — I639 Cerebral infarction, unspecified: Secondary | ICD-10-CM | POA: Diagnosis not present

## 2015-03-06 DIAGNOSIS — N183 Chronic kidney disease, stage 3 (moderate): Secondary | ICD-10-CM | POA: Diagnosis present

## 2015-03-06 DIAGNOSIS — I10 Essential (primary) hypertension: Secondary | ICD-10-CM | POA: Diagnosis present

## 2015-03-06 DIAGNOSIS — K219 Gastro-esophageal reflux disease without esophagitis: Secondary | ICD-10-CM | POA: Diagnosis not present

## 2015-03-06 DIAGNOSIS — R531 Weakness: Secondary | ICD-10-CM | POA: Diagnosis present

## 2015-03-06 DIAGNOSIS — I6789 Other cerebrovascular disease: Secondary | ICD-10-CM | POA: Diagnosis not present

## 2015-03-06 DIAGNOSIS — I129 Hypertensive chronic kidney disease with stage 1 through stage 4 chronic kidney disease, or unspecified chronic kidney disease: Secondary | ICD-10-CM | POA: Diagnosis present

## 2015-03-06 DIAGNOSIS — I63411 Cerebral infarction due to embolism of right middle cerebral artery: Principal | ICD-10-CM | POA: Diagnosis present

## 2015-03-06 DIAGNOSIS — G8194 Hemiplegia, unspecified affecting left nondominant side: Secondary | ICD-10-CM | POA: Diagnosis present

## 2015-03-06 DIAGNOSIS — E210 Primary hyperparathyroidism: Secondary | ICD-10-CM | POA: Diagnosis present

## 2015-03-06 DIAGNOSIS — I6523 Occlusion and stenosis of bilateral carotid arteries: Secondary | ICD-10-CM | POA: Insufficient documentation

## 2015-03-06 DIAGNOSIS — I63131 Cerebral infarction due to embolism of right carotid artery: Secondary | ICD-10-CM | POA: Diagnosis not present

## 2015-03-06 HISTORY — DX: Disorder of kidney and ureter, unspecified: N28.9

## 2015-03-06 LAB — COMPREHENSIVE METABOLIC PANEL
ALBUMIN: 3.7 g/dL (ref 3.5–5.2)
ALK PHOS: 89 U/L (ref 39–117)
ALT: 16 U/L (ref 0–35)
ANION GAP: 11 (ref 5–15)
AST: 21 U/L (ref 0–37)
BUN: 18 mg/dL (ref 6–23)
CO2: 24 mmol/L (ref 19–32)
Calcium: 9.4 mg/dL (ref 8.4–10.5)
Chloride: 107 mmol/L (ref 96–112)
Creatinine, Ser: 1.24 mg/dL — ABNORMAL HIGH (ref 0.50–1.10)
GFR calc Af Amer: 46 mL/min — ABNORMAL LOW (ref >90)
GFR, EST NON AFRICAN AMERICAN: 39 mL/min — AB (ref >90)
Glucose, Bld: 108 mg/dL — ABNORMAL HIGH (ref 70–99)
Potassium: 3.8 mmol/L (ref 3.5–5.1)
Sodium: 142 mmol/L (ref 135–145)
Total Bilirubin: 0.5 mg/dL (ref 0.3–1.2)
Total Protein: 7.3 g/dL (ref 6.0–8.3)

## 2015-03-06 LAB — I-STAT CHEM 8, ED
BUN: 19 mg/dL (ref 6–23)
CREATININE: 1.2 mg/dL — AB (ref 0.50–1.10)
Calcium, Ion: 1.16 mmol/L (ref 1.13–1.30)
Chloride: 106 mmol/L (ref 96–112)
Glucose, Bld: 106 mg/dL — ABNORMAL HIGH (ref 70–99)
HEMATOCRIT: 39 % (ref 36.0–46.0)
Hemoglobin: 13.3 g/dL (ref 12.0–15.0)
POTASSIUM: 3.7 mmol/L (ref 3.5–5.1)
Sodium: 143 mmol/L (ref 135–145)
TCO2: 21 mmol/L (ref 0–100)

## 2015-03-06 LAB — TSH: TSH: 1.29 u[IU]/mL (ref 0.350–4.500)

## 2015-03-06 LAB — I-STAT TROPONIN, ED: TROPONIN I, POC: 0 ng/mL (ref 0.00–0.08)

## 2015-03-06 LAB — DIFFERENTIAL
BASOS ABS: 0 10*3/uL (ref 0.0–0.1)
Basophils Relative: 0 % (ref 0–1)
EOS ABS: 0.1 10*3/uL (ref 0.0–0.7)
EOS PCT: 1 % (ref 0–5)
LYMPHS ABS: 2.2 10*3/uL (ref 0.7–4.0)
Lymphocytes Relative: 21 % (ref 12–46)
MONOS PCT: 8 % (ref 3–12)
Monocytes Absolute: 0.8 10*3/uL (ref 0.1–1.0)
NEUTROS ABS: 7.3 10*3/uL (ref 1.7–7.7)
NEUTROS PCT: 70 % (ref 43–77)

## 2015-03-06 LAB — URINALYSIS, ROUTINE W REFLEX MICROSCOPIC
BILIRUBIN URINE: NEGATIVE
Glucose, UA: NEGATIVE mg/dL
HGB URINE DIPSTICK: NEGATIVE
Ketones, ur: NEGATIVE mg/dL
Nitrite: NEGATIVE
PH: 5.5 (ref 5.0–8.0)
Protein, ur: 30 mg/dL — AB
Specific Gravity, Urine: 1.01 (ref 1.005–1.030)
Urobilinogen, UA: 0.2 mg/dL (ref 0.0–1.0)

## 2015-03-06 LAB — CBC
HEMATOCRIT: 37.1 % (ref 36.0–46.0)
Hemoglobin: 12.1 g/dL (ref 12.0–15.0)
MCH: 28.4 pg (ref 26.0–34.0)
MCHC: 32.6 g/dL (ref 30.0–36.0)
MCV: 87.1 fL (ref 78.0–100.0)
Platelets: 241 10*3/uL (ref 150–400)
RBC: 4.26 MIL/uL (ref 3.87–5.11)
RDW: 14.3 % (ref 11.5–15.5)
WBC: 10.4 10*3/uL (ref 4.0–10.5)

## 2015-03-06 LAB — RAPID URINE DRUG SCREEN, HOSP PERFORMED
Amphetamines: NOT DETECTED
Barbiturates: NOT DETECTED
Benzodiazepines: NOT DETECTED
COCAINE: NOT DETECTED
OPIATES: NOT DETECTED
TETRAHYDROCANNABINOL: NOT DETECTED

## 2015-03-06 LAB — PHOSPHORUS: PHOSPHORUS: 3.3 mg/dL (ref 2.3–4.6)

## 2015-03-06 LAB — URINE MICROSCOPIC-ADD ON

## 2015-03-06 LAB — PROTIME-INR
INR: 0.99 (ref 0.00–1.49)
Prothrombin Time: 13.2 seconds (ref 11.6–15.2)

## 2015-03-06 LAB — ETHANOL: Alcohol, Ethyl (B): <5 mg/dL (ref 0–9)

## 2015-03-06 LAB — CBG MONITORING, ED: GLUCOSE-CAPILLARY: 100 mg/dL — AB (ref 70–99)

## 2015-03-06 LAB — APTT: aPTT: 28 seconds (ref 24–37)

## 2015-03-06 MED ORDER — ASPIRIN EC 81 MG PO TBEC
81.0000 mg | DELAYED_RELEASE_TABLET | Freq: Every day | ORAL | Status: DC
  Administered 2015-03-06 – 2015-03-09 (×4): 81 mg via ORAL
  Filled 2015-03-06 (×4): qty 1

## 2015-03-06 MED ORDER — HEPARIN SODIUM (PORCINE) 5000 UNIT/ML IJ SOLN
5000.0000 [IU] | Freq: Three times a day (TID) | INTRAMUSCULAR | Status: DC
  Administered 2015-03-06 – 2015-03-07 (×2): 5000 [IU] via SUBCUTANEOUS
  Filled 2015-03-06 (×2): qty 1

## 2015-03-06 MED ORDER — STROKE: EARLY STAGES OF RECOVERY BOOK: Freq: Once | Status: DC

## 2015-03-06 MED ORDER — STUDY - INVESTIGATIONAL DRUG SIMPLE RECORD
75.0000 mg | Freq: Every day | Status: DC
  Administered 2015-03-07 – 2015-03-09 (×3): 75 mg via ORAL
  Filled 2015-03-06 (×4): qty 0.01

## 2015-03-06 MED ORDER — STUDY - INVESTIGATIONAL DRUG SIMPLE RECORD
600.0000 mg | Status: AC
  Administered 2015-03-06: 600 mg via ORAL
  Filled 2015-03-06: qty 0.12

## 2015-03-06 MED ORDER — METOPROLOL TARTRATE 50 MG PO TABS
50.0000 mg | ORAL_TABLET | Freq: Every day | ORAL | Status: DC
  Administered 2015-03-07 – 2015-03-09 (×3): 50 mg via ORAL
  Filled 2015-03-06 (×3): qty 1

## 2015-03-06 MED ORDER — CHOLECALCIFEROL 10 MCG (400 UNIT) PO TABS
400.0000 [IU] | ORAL_TABLET | Freq: Every day | ORAL | Status: DC
  Administered 2015-03-07 – 2015-03-09 (×3): 400 [IU] via ORAL
  Filled 2015-03-06 (×3): qty 1

## 2015-03-06 MED ORDER — FERROUS SULFATE 325 (65 FE) MG PO TABS
325.0000 mg | ORAL_TABLET | Freq: Every day | ORAL | Status: DC
  Administered 2015-03-07 – 2015-03-09 (×3): 325 mg via ORAL
  Filled 2015-03-06 (×3): qty 1

## 2015-03-06 MED ORDER — ACETAMINOPHEN 650 MG RE SUPP
650.0000 mg | RECTAL | Status: DC | PRN
Start: 2015-03-06 — End: 2015-03-09

## 2015-03-06 MED ORDER — PANTOPRAZOLE SODIUM 40 MG PO TBEC: 40.0000 mg | DELAYED_RELEASE_TABLET | Freq: Every day | ORAL | Status: DC

## 2015-03-06 MED ORDER — POTASSIUM CHLORIDE CRYS ER 20 MEQ PO TBCR
20.0000 meq | EXTENDED_RELEASE_TABLET | Freq: Two times a day (BID) | ORAL | Status: DC
  Administered 2015-03-06 – 2015-03-09 (×6): 20 meq via ORAL
  Filled 2015-03-06 (×6): qty 1

## 2015-03-06 MED ORDER — FELODIPINE ER 10 MG PO TB24
10.0000 mg | ORAL_TABLET | Freq: Every day | ORAL | Status: DC
  Administered 2015-03-07 – 2015-03-09 (×3): 10 mg via ORAL
  Filled 2015-03-06 (×3): qty 1

## 2015-03-06 MED ORDER — LOSARTAN POTASSIUM 50 MG PO TABS
25.0000 mg | ORAL_TABLET | Freq: Every day | ORAL | Status: DC
  Administered 2015-03-07 – 2015-03-09 (×3): 25 mg via ORAL
  Filled 2015-03-06 (×3): qty 1

## 2015-03-06 MED ORDER — ROSUVASTATIN CALCIUM 20 MG PO TABS
20.0000 mg | ORAL_TABLET | Freq: Every day | ORAL | Status: DC
  Administered 2015-03-07 – 2015-03-09 (×3): 20 mg via ORAL
  Filled 2015-03-06 (×3): qty 1

## 2015-03-06 MED ORDER — ACETAMINOPHEN 325 MG PO TABS: 650.0000 mg | ORAL_TABLET | ORAL | Status: DC | PRN

## 2015-03-06 NOTE — Progress Notes (Signed)
Pt arrived to 4N29 at 1734.  Pt A&O x 4,  NIH of 2 for LUE drift and LUE ataxia .   Pt V/S taken, pt will be Q2 V/S and neuro checks until 0530. Pt without distress.  Family at the bedside. Diet ordered, will monitor.

## 2015-03-06 NOTE — Code Documentation (Signed)
81yo female arriving to [facility] via private vehicle at 1444.  Patient reports having left face and arm numbness and heaviness while leaving [employer].  Code stroke called on arrival to the ED.  Patient to CT.  Stroke team to the bedside.  NIHSS 3, see documentation for details for and code stroke times.  Dr. Nicole Kindred at the bedside.  Symptoms mild and improving.  No acute stroke treatment.  Patient remains in the window to treat with tPA until 1615.  Bedside handoff with ED RN Ebony Hail.

## 2015-03-06 NOTE — Progress Notes (Signed)
Pt has been enrolled in POINT study.  Please do not order Lovenox, heparin, or non-study Plavix.  Thank you.  Wynona Neat, PharmD, BCPS 03/06/2015 11:35 PM

## 2015-03-06 NOTE — Consult Note (Signed)
Referring Physician: Kathrynn Humble    Chief Complaint: Left arm weakness and decreased sensation  HPI:                                                                                                                                         [name] is an 81 y.o. female who was at [place] when she had a sudden onset of left arm weakness and decreased sensation. On arrival to ED she started to have symptoms resolve. While in CT her symptoms fully resolved. At present time she feels back to her baseline. Ct head showed no acute stroke, bleed or mass.  Current exam shows only dysmetria of left arm.   Date last known well: Date: 03/06/2015 Time last known well: Time: 13:15 tPA Given: No: symptoms resolved Modified Rankin: Rankin Score=0    Past Medical History  Diagnosis Date  . Hypertension   . Cataract   . Parathyroid disease     Clarify with patient. No detail indicated on history form.  . Cancer     breast    Past Surgical History  Procedure Laterality Date  . Parathyroidectomy  June 2010  . Breast lumpectomy  2008    Confirm type with patient. Not indicated on medical history form dated 12/26/10.    Family History  Problem Relation Age of Onset  . Stroke Mother   . Hypertension Mother   . Stroke Father    Social History:  reports that she has been passively smoking.  She has never used smokeless tobacco. She reports that she drinks alcohol. She reports that she does not use illicit drugs.  Allergies: No Known Allergies  Medications:                                                                                                                           No current facility-administered medications for this encounter.   Current Outpatient Prescriptions  Medication Sig Dispense Refill  . anastrozole (ARIMIDEX) 1 MG tablet Take 1 mg by mouth daily.      . Cholecalciferol ([employer] VIT D 5000 HIGH-POTENCY PO) Take by mouth.      . doxycycline (VIBRAMYCIN) 100 MG capsule Take 1  capsule (100 mg total) by mouth 2 (two) times daily. 20 capsule 0  . felodipine (PLENDIL) 2.5 MG 24 hr tablet Take 10 mg by mouth  daily. Very medication name, dosage and frequency. Not indicated on history form.    . ferrous fumarate (FERRO-SEQUELS) 50 MG CR tablet Take 65 mg by mouth daily. Verify type, dosage and frequency with patient. Not indicated on history form.    . ferrous sulfate 325 (65 FE) MG tablet Take 325 mg by mouth daily with breakfast.    . ibandronate (BONIVA) 150 MG tablet Take 150 mg by mouth every 30 (thirty) days. Take in the morning with a full glass of water, on an empty stomach, and do not take anything else by mouth or lie down for the next 30 min.     Marland Kitchen losartan (COZAAR) 25 MG tablet Take 25 mg by mouth daily.    . metoprolol (LOPRESSOR) 50 MG tablet Take 25 mg by mouth 2 (two) times daily. Verify dosage/frequency with patient. Not indicated on history form.    . potassium chloride (KLOR-CON) 8 MEQ tablet Take 20 mEq by mouth daily. Verify type, dosage and frequency. Not indicated on history form.    . potassium chloride SA (K-DUR,KLOR-CON) 20 MEQ tablet Take 20 mEq by mouth 2 (two) times daily.    . rosuvastatin (CRESTOR) 20 MG tablet Take 20 mg by mouth daily. Cholesterol med- not sure of name or dose.       ROS:                                                                                                                                       History obtained from the patient  General ROS: negative for - chills, fatigue, fever, night sweats, weight gain or weight loss Psychological ROS: negative for - behavioral disorder, hallucinations, memory difficulties, mood swings or suicidal ideation Ophthalmic ROS: negative for - blurry vision, double vision, eye pain or loss of vision ENT ROS: negative for - epistaxis, nasal discharge, oral lesions, sore throat, tinnitus or vertigo Allergy and Immunology ROS: negative for - hives or itchy/watery eyes Hematological and  Lymphatic ROS: negative for - bleeding problems, bruising or swollen lymph nodes Endocrine ROS: negative for - galactorrhea, hair pattern changes, polydipsia/polyuria or temperature intolerance Respiratory ROS: negative for - cough, hemoptysis, shortness of breath or wheezing Cardiovascular ROS: negative for - chest pain, dyspnea on exertion, edema or irregular heartbeat Gastrointestinal ROS: negative for - abdominal pain, diarrhea, hematemesis, nausea/vomiting or stool incontinence Genito-Urinary ROS: negative for - dysuria, hematuria, incontinence or urinary frequency/urgency Musculoskeletal ROS: negative for - joint swelling or muscular weakness Neurological ROS: as noted in HPI Dermatological ROS: negative for rash and skin lesion changes  Neurologic Examination:  Blood pressure 152/64, pulse 87, temperature 98.9 F (37.2 C), temperature source Oral, resp. rate 18, SpO2 97 %.  HEENT-  Normocephalic, no lesions, without obvious abnormality.  Normal external eye and conjunctiva.  Normal TM's bilaterally.  Normal auditory canals and external ears. Normal external nose, mucus membranes and septum.  Normal pharynx. Cardiovascular- S1, S2 normal, pulses palpable throughout   Lungs- chest clear, no wheezing, rales, normal symmetric air entry Abdomen- normal findings: bowel sounds normal Extremities- no edema Lymph-no adenopathy palpable Musculoskeletal-no joint tenderness, deformity or swelling Skin-warm and dry, no hyperpigmentation, vitiligo, or suspicious lesions  Neurological Examination Mental Status: Alert, oriented, thought content appropriate.  Speech fluent without evidence of aphasia.  Able to follow 3 step commands without difficulty. Cranial Nerves: II: Discs flat bilaterally; Visual fields grossly normal, pupils equal, round, reactive to light and accommodation III,IV, VI: ptosis  not present, extra-ocular motions intact bilaterally V,VII: smile symmetric, facial light touch sensation normal bilaterally VIII: hearing normal bilaterally IX,X: uvula rises symmetrically XI: bilateral shoulder shrug XII: midline tongue extension Motor: Right : Upper extremity   5/5    Left:     Upper extremity   5/5--give way strength and inconsistent  Lower extremity   5/5     Lower extremity   5/5 Tone and bulk:normal tone throughout; no atrophy noted Sensory: Pinprick and light touch intact throughout, bilaterally Deep Tendon Reflexes: 2+ and symmetric throughout Plantars: Right: downgoing   Left: downgoing Cerebellar: Left  finger-to-nose dysmetric, normal rapid alternating movements and normal heel-to-shin test Gait: not tested secondary to safety       Lab Results: Basic Metabolic Panel: No results for input(s): NA, K, CL, CO2, GLUCOSE, BUN, CREATININE, CALCIUM, MG, PHOS in the last 168 hours.  Liver Function Tests: No results for input(s): AST, ALT, ALKPHOS, BILITOT, PROT, ALBUMIN in the last 168 hours. No results for input(s): LIPASE, AMYLASE in the last 168 hours. No results for input(s): AMMONIA in the last 168 hours.  CBC: No results for input(s): WBC, NEUTROABS, HGB, HCT, MCV, PLT in the last 168 hours.  Cardiac Enzymes: No results for input(s): CKTOTAL, CKMB, CKMBINDEX, TROPONINI in the last 168 hours.  Lipid Panel: No results for input(s): CHOL, TRIG, HDL, CHOLHDL, VLDL, LDLCALC in the last 168 hours.  CBG:  Recent Labs Lab 03/06/15 1544  GLUCAP 100*    Microbiology: Results for orders placed or performed in visit on 03/13/11  Surgical pcr screen     Status: Abnormal   Collection Time: 03/13/11 12:30 PM  Result Value Ref Range Status   MRSA, PCR (A) NEGATIVE Final    INVALID RESULTS, SPECIMEN SENT FOR CULTURE W HENDRICK AT 1420 ON 05.03.2012 BY NBROOKS   Staphylococcus aureus (A) NEGATIVE Final    INVALID RESULTS, SPECIMEN SENT FOR CULTURE W  HENDRICK AT 4854 ON 05.03.2012 BY NBROOKS        The Xpert SA Assay (FDA approved for NASAL specimens only), is one component of a comprehensive surveillance program.  It is not intended to diagnose infection nor to guide or monitor treatment.    Coagulation Studies: No results for input(s): LABPROT, INR in the last 72 hours.  Imaging: No results found.   Etta Quill PA-C [facility] [phone]  03/06/2015, 3:48 PM   Assessment: 81 y.o. female Presenting to ED with symptoms of left arm weakness and decreased sensation.  Symptoms have fully resolved at this time.  Exam notable for left finger to nose dysmetria.  At this time she is  not a tPA candidate do to minimal symptoms.   Stroke Risk Factors - hypertension  Recommend 1. HgbA1c, fasting lipid panel 2. MRI, MRA  of the brain without contrast 3. PT consult, OT consult, Speech consult 4. Echocardiogram 5. Carotid dopplers 6. Prophylactic therapy-Antiplatelet med: Aspirin - dose 81 mg daily 7. Risk factor modification 8. Telemetry monitoring 9. Frequent neuro checks 10 NPO until passes stroke swallow screen 11 Stroke team to follow in AM  I personally participated in this patient's evaluation and management, including examination, as well as formulating the above clinical impression and management recommendations.  Rush Farmer M.D. [facility] [phone]

## 2015-03-06 NOTE — ED Provider Notes (Signed)
CSN: [id number]     Arrival date & time 03/06/15  1444 History   First MD Initiated Contact with Patient 03/06/15 1531     Chief Complaint  Patient presents with  . Code Stroke     (Consider location/radiation/quality/duration/timing/severity/associated sxs/prior Treatment) HPI   This is an 81 year old female, with a history of hypertension, breast cancer, presenting today with weakness, code stroke called in triage. One set 1315 today. Located left upper extremity. Persistent, mild. Negative for any new medications taken. Negative for head trauma, headache. Positive for sensory deficit to the left face. Negative for change in vision. Negative for change in speech. Negative for change in ambulation. Negative for chest pain, shortness of breath. Negative for alcohol or drug use today. Negative for cough, dysuria.  Past Medical History  Diagnosis Date  . Hypertension   . Cataract   . Parathyroid disease     Clarify with patient. No detail indicated on history form.  . Thyroid disease   . Cancer     breast   Past Surgical History  Procedure Laterality Date  . Parathyroidectomy  June 2010  . Breast lumpectomy  2008    Confirm type with patient. Not indicated on medical history form dated 12/26/10.   Family History  Problem Relation Age of Onset  . Stroke Mother   . Hypertension Mother   . Stroke Father    History  Substance Use Topics  . Smoking status: Passive Smoke Exposure - Never Smoker  . Smokeless tobacco: Never Used     Comment: "When using the bathroom"  . Alcohol Use: Yes     Comment: Glass of wine on holidays.   OB History    No data available     Review of Systems  Constitutional: Negative for fever and chills.  HENT: Negative for facial swelling.   Eyes: Negative for photophobia and pain.  Respiratory: Negative for cough and shortness of breath.   Cardiovascular: Negative for chest pain and leg swelling.  Gastrointestinal: Negative for nausea, vomiting and  abdominal pain.  Genitourinary: Negative for dysuria.  Musculoskeletal: Negative for arthralgias.  Skin: Negative for rash and wound.  Neurological: Positive for weakness. Negative for seizures.  Hematological: Negative for adenopathy.      Allergies  Review of patient's allergies indicates no known allergies.  Home Medications   Prior to Admission medications   Medication Sig Start Date End Date Taking? Authorizing Provider  anastrozole (ARIMIDEX) 1 MG tablet Take 1 mg by mouth daily.      Historical Provider, MD  Cholecalciferol ([employer]S VIT D 5000 HIGH-POTENCY PO) Take by mouth.      Historical Provider, MD  doxycycline (VIBRAMYCIN) 100 MG capsule Take 1 capsule (100 mg total) by mouth 2 (two) times daily. 11/05/14   Audelia Hives Presson, PA  felodipine (PLENDIL) 2.5 MG 24 hr tablet Take 10 mg by mouth daily. Very medication name, dosage and frequency. Not indicated on history form.    Historical Provider, MD  ferrous fumarate (FERRO-SEQUELS) 50 MG CR tablet Take 65 mg by mouth daily. Verify type, dosage and frequency with patient. Not indicated on history form.    Historical Provider, MD  ferrous sulfate 325 (65 FE) MG tablet Take 325 mg by mouth daily with breakfast.    Historical Provider, MD  ibandronate (BONIVA) 150 MG tablet Take 150 mg by mouth every 30 (thirty) days. Take in the morning with a full glass of water, on an empty stomach, and do not take  anything else by mouth or lie down for the next 30 min.     Historical Provider, MD  losartan (COZAAR) 25 MG tablet Take 25 mg by mouth daily.    Historical Provider, MD  metoprolol (LOPRESSOR) 50 MG tablet Take 25 mg by mouth 2 (two) times daily. Verify dosage/frequency with patient. Not indicated on history form.    Historical Provider, MD  potassium chloride (KLOR-CON) 8 MEQ tablet Take 20 mEq by mouth daily. Verify type, dosage and frequency. Not indicated on history form.    Historical Provider, MD  potassium chloride SA  (K-DUR,KLOR-CON) 20 MEQ tablet Take 20 mEq by mouth 2 (two) times daily.    Historical Provider, MD  rosuvastatin (CRESTOR) 20 MG tablet Take 20 mg by mouth daily. Cholesterol med- not sure of name or dose.    Historical Provider, MD   BP 152/64 mmHg  Pulse 87  Temp(Src) 98.9 F (37.2 C) (Oral)  Resp 18  SpO2 97% Physical Exam  Constitutional: She is oriented to person, place, and time. She appears well-developed and well-nourished. No distress.  HENT:  Head: Normocephalic and atraumatic.  Mouth/Throat: No oropharyngeal exudate.  Eyes: Conjunctivae are normal. Pupils are equal, round, and reactive to light. No scleral icterus.  Neck: Normal range of motion. No tracheal deviation present. No thyromegaly present.  Cardiovascular: Normal rate, regular rhythm and normal heart sounds.  Exam reveals no gallop and no friction rub.   No murmur heard. Pulmonary/Chest: Effort normal and breath sounds normal. No stridor. No respiratory distress. She has no wheezes. She has no rales. She exhibits no tenderness.  Abdominal: Soft. She exhibits no distension and no mass. There is no tenderness. There is no rebound and no guarding.  Musculoskeletal: Normal range of motion. She exhibits no edema.  Neurological: She is alert and oriented to person, place, and time. A sensory deficit (left face) is present. No cranial nerve deficit. GCS eye subscore is 4. GCS verbal subscore is 5. GCS motor subscore is 6.  Reflex Scores:      Patellar reflexes are 2+ on the right side and 2+ on the left side. Positive for mild weakness to the LUE  Skin: Skin is warm and dry. She is not diaphoretic.    ED Course  Procedures (including critical care time) Labs Review Labs Reviewed  CBG MONITORING, ED - Abnormal; Notable for the following:    Glucose-Capillary 100 (*)    All other components within normal limits  ETHANOL  PROTIME-INR  APTT  CBC  DIFFERENTIAL  COMPREHENSIVE METABOLIC PANEL  URINE RAPID DRUG SCREEN  (HOSP PERFORMED)  URINALYSIS, ROUTINE W REFLEX MICROSCOPIC  I-STAT CHEM 8, ED  I-STAT TROPOININ, ED  I-STAT TROPOININ, ED    Imaging Review Ct Head Wo Contrast  03/06/2015   CLINICAL DATA:  Left-sided weakness.  EXAM: CT HEAD WITHOUT CONTRAST  TECHNIQUE: Contiguous axial images were obtained from the base of the skull through the vertex without intravenous contrast.  COMPARISON:  None.  FINDINGS: Foci of hypoattenuation are noted within the post central gyrus and pre frontal gyrus on the right the basal ganglia are intact. The insular ribbon is intact. Cerebellum is unremarkable.  No acute hemorrhage or mass lesion is present.  The paranasal sinuses and mastoid air cells are clear. The calvarium is intact. Vascular calcifications are present within the cavernous internal carotid arteries and at the dural margin of the vertebral arteries. The patient is status post bilateral lens replacements. The globes and orbits are otherwise  intact.  ASPECTS score = 8/10  [place] Stroke Program Early CT Score  Normal score = 10  IMPRESSION: 1. Acute nonhemorrhagic infarcts involving the right postcentral gyrus and right pre frontal gyrus. 2. Atherosclerosis. 3. Aspects score is 8/10 These results were called by telephone at the time of interpretation on 03/06/2015 at 3:53 Pm to Dr. Wallie Char , who verbally acknowledged these results.   Electronically Signed   By: San Morelle M.D.   On: 03/06/2015 16:28    MDM   Final diagnoses:  Stroke    This is an 81 year old female, with a history of hypertension, breast cancer, presenting today with weakness, code stroke called in triage. One set 1315 today. Located left upper extremity. Persistent, mild. Negative for any new medications taken. Negative for head trauma, headache. Positive for sensory deficit to the left face. Negative for change in vision. Negative for change in speech. Negative for change in ambulation. Negative for chest pain, shortness of  breath. Negative for alcohol or drug use today. Negative for cough, dysuria.  Code stroke called in triage. I have met the patient in CT scanner.  Neurologic exam is within normal limits, with the exception of mild weakness to the left upper extremity.  Blood glucose is 100. Negative for gross blood on CT scan of the head positive for possible infarct. Neurology is at bedside. Weakness has substantially improved over the last few minutes. They do not recommend TPA administration at this time. I agree with this assessment and plan. They recommend admission to the hospitalist service for stroke workup. I will follow-up on remainder of workup here, monitor the patient closely until patient is admitted to the hospitalist service.  Symptoms have completely resolved at this time.  Patient remained stable. Negative for concerning acute abnormalities on remainder of workup. Patient is being admitted to the hospitalist service for stroke workup.  6I have discussed case and care has been guided by my attending physician, Dr. Kathrynn Humble.  Doy Hutching, MD 03/06/15 [place], MD 03/08/15 [phone]

## 2015-03-06 NOTE — H&P (Signed)
[facility] History and Physical  [name] KDX:[id number] DOB: Aug 24, 1931 DOA: 03/06/2015  Referring physician: Dr. Kathrynn Humble  PCP: Haywood Pao, MD   Chief Complaint: left arm weakness and numbness  HPI: [name] is a 81 y.o. female past medical history significant for hypertension, hyperlipidemia, CKD stage 2-3, primary parathyroidism (status post parathyroid resection in 2010), history of breast cancer and mild tobacco abuse; who was at [employer] today, when she had sudden developed left arm weakness, decrease sensation and numbness. On arrival to ED she started to have improvement in her symptoms and by the time she went to CT scan her symptoms were pretty much resolved and that is the reason why TPA was not given. Patient CT demonstrated right postcentral gyrus and right frontal gyrus stroke. [facility] called to admit patient for further stroke work up.   Of note, Patient denies CP, nausea, vomiting, headache, fever, palpitations, abd pain, dysuria, hematuria, melena and hematochezia. Blood workup in ED essentially unremarkable, except for his CKD stage 2-3 with creatinine of 1.2 at baseline.    Review of Systems:  Negative except as otherwise mentioned on history of present illness.  Past Medical History  Diagnosis Date  . Hypertension   . Cataract   . Parathyroid disease     Clarify with patient. No detail indicated on history form.  . Cancer     breast   Past Surgical History  Procedure Laterality Date  . Parathyroidectomy  June 2010  . Breast lumpectomy  2008    Confirm type with patient. Not indicated on medical history form dated 12/26/10.   Social History:  reports that she has been passively smoking.  She has never used smokeless tobacco. She reports that she drinks alcohol. She reports that she does not use illicit drugs.  No Known Allergies  Family History  Problem Relation Age of Onset  . Stroke Mother   . Hypertension Mother   . Stroke Father      Prior to Admission medications   Medication Sig Start Date End Date Taking? Authorizing Provider  cholecalciferol (VITAMIN D) 400 UNITS TABS tablet Take 400 Units by mouth daily.   Yes Historical Provider, MD  felodipine (PLENDIL) 10 MG 24 hr tablet Take 10 mg by mouth daily.   Yes Historical Provider, MD  ferrous sulfate 325 (65 FE) MG tablet Take 325 mg by mouth daily with breakfast.   Yes Historical Provider, MD  ibandronate (BONIVA) 150 MG tablet Take 150 mg by mouth every 30 (thirty) days. Pt takes on 6th --Take in the morning with a full glass of water, on an empty stomach, and do not take anything else by mouth or lie down for the next 30 min.   Yes Historical Provider, MD  losartan (COZAAR) 25 MG tablet Take 25 mg by mouth daily.   Yes Historical Provider, MD  metoprolol (LOPRESSOR) 50 MG tablet Take 50 mg by mouth daily. Verify dosage/frequency with patient. Not indicated on history form.   Yes Historical Provider, MD  potassium chloride SA (K-DUR,KLOR-CON) 20 MEQ tablet Take 20 mEq by mouth 2 (two) times daily.   Yes Historical Provider, MD  rosuvastatin (CRESTOR) 20 MG tablet Take 20 mg by mouth daily. Cholesterol med- not sure of name or dose.   Yes Historical Provider, MD  doxycycline (VIBRAMYCIN) 100 MG capsule Take 1 capsule (100 mg total) by mouth 2 (two) times daily. Patient not taking: Reported on 03/06/2015 11/05/14   Lutricia Feil, PA  Physical Exam: Filed Vitals:   03/06/15 1634 03/06/15 1645 03/06/15 1700 03/06/15 1734  BP: 140/63 157/71 160/80 167/69  Pulse:  82 82 81  Temp: 98.5 F (36.9 C)   98.6 F (37 C)  TempSrc: Oral   Oral  Resp: 14 16 16 18   SpO2: 100% 100% 100% 100%    Wt Readings from Last 3 Encounters:  04/12/12 53.751 kg (118 lb 8 oz)  09/23/11 52.277 kg (115 lb 4 oz)  05/21/11 50.168 kg (110 lb 9.6 oz)    General:  Appears calm and comfortable; denies any acute complaints currently and endorses complete resolution of her left  weakness/numbness sensation. Patient able to speak in full sentences, no dysarthria; alert, cooperative with examination, awake and oriented 3. Eyes: PERRL, normal lids, irises & conjunctiva, no icterus, no nystagmus ENT: grossly normal hearing, lips & tongue; no exudates, no erythema, no thrush; that is no drainage out of ears or nostrils. Dentures in place Neck: no LAD, masses or thyromegaly, no JVD Cardiovascular: RRR, no m/r/g. No LE edema. Telemetry: SR and no arrhythmia appreciated on emergency department telemetry. Respiratory: CTA bilaterally, no w/r/r. Normal respiratory effort. Abdomen: soft, nontender, nondistended, positive bowel sounds, no guarding Skin: no rash, petechiae, open wounds or induration seen on limited exam Musculoskeletal: grossly normal tone BUE/BLE, no joint swelling, full range of motion Psychiatric: grossly normal mood and affect, speech fluent and appropriate Neurologic: Patient without dysarthria, cranial nerves grossly intact 2-12, muscle strength 5 out of 5 bilaterally and symmetrically; patient with abnormal finger to nose on her left side due to some dysmetria otherwise completely benign neurologic examination.           Labs on Admission:  Basic Metabolic Panel:  Recent Labs Lab 03/06/15 1545 03/06/15 1556  NA 142 143  K 3.8 3.7  CL 107 106  CO2 24  --   GLUCOSE 108* 106*  BUN 18 19  CREATININE 1.24* 1.20*  CALCIUM 9.4  --    Liver Function Tests:  Recent Labs Lab 03/06/15 1545  AST 21  ALT 16  ALKPHOS 89  BILITOT 0.5  PROT 7.3  ALBUMIN 3.7   CBC:  Recent Labs Lab 03/06/15 1545 03/06/15 1556  WBC 10.4  --   NEUTROABS 7.3  --   HGB 12.1 13.3  HCT 37.1 39.0  MCV 87.1  --   PLT 241  --    CBG:  Recent Labs Lab 03/06/15 1544  GLUCAP 100*    Radiological Exams on Admission: Ct Head Wo Contrast  03/06/2015   CLINICAL DATA:  Left-sided weakness.  EXAM: CT HEAD WITHOUT CONTRAST  TECHNIQUE: Contiguous axial images were  obtained from the base of the skull through the vertex without intravenous contrast.  COMPARISON:  None.  FINDINGS: Foci of hypoattenuation are noted within the post central gyrus and pre frontal gyrus on the right the basal ganglia are intact. The insular ribbon is intact. Cerebellum is unremarkable.  No acute hemorrhage or mass lesion is present.  The paranasal sinuses and mastoid air cells are clear. The calvarium is intact. Vascular calcifications are present within the cavernous internal carotid arteries and at the dural margin of the vertebral arteries. The patient is status post bilateral lens replacements. The globes and orbits are otherwise intact.  ASPECTS score = 8/10  [place] Stroke Program Early CT Score  Normal score = 10  IMPRESSION: 1. Acute nonhemorrhagic infarcts involving the right postcentral gyrus and right pre frontal gyrus. 2. Atherosclerosis. 3. Aspects  score is 8/10 These results were called by telephone at the time of interpretation on 03/06/2015 at 3:53 Pm to Dr. Wallie Char , who verbally acknowledged these results.   Electronically Signed   By: San Morelle M.D.   On: 03/06/2015 16:28    EKG:  No acute signs of ischemia, but with progression, signs of left ventricular hypertrophy and normal axis. Patient in sinus rhythm  Assessment/Plan 1-acute right side Stroke: Patient presented with left side upper arm weakness, heaviness/numbness sensation.patient's symptoms resolve rapidly by the time she had CT scan done during acute code stroke process. CT scan demonstrated right post-central gyrus and right frontal gyrus stroke. -Patient will be admitted to neuro telemetry bed -Will check 2-D echo, carotid Dopplers, MRI/MRA -Will also check A1c, lipid panel, TSH and will ask PT/OT/SPL to provide an assessment  -Patient has never been on aspirin prior to this admission -Risk factors includes age, hypertension, hyperlipidemia -After discussing with neurology and will be  completed stroke workup and use aspirin for secondary prevention   2-High blood pressure: Stable and well controlled. -Will continue home medication regimen -Plan is to start heart healthy diet once she passed swallowing evaluation  3-Breast cancer: Continue Arimidex -Patient on admission -Will continue outpatient follow-up with oncology  4-Hyperparathyroidism, primary: Status post parathyroid surgical removal in 2010 -Will check phosphorus, PTH and calcium  5-HLD (hyperlipidemia): Will check fasting lipid profile and continue statins  6-GERD without esophagitis: Will start PPI  7-Chronic kidney disease stage II-III: Creatinine 1.2 at baseline -Currently stable. -We will monitor renal function  8-tobacco abuse/secondhand smoking:  -Cessation counseling has been provided -nicotine patch decline   Neuro hospitalist (Dr. Nicole Kindred)  Code Status: Full code DVT Prophylaxis: Heparin Family Communication: Daughter at bedside Disposition Plan:  Inpatient, LOS > 2 midnights; telemetry bed  Time spent: 60 minutes  Barton Dubois [facility] Pager [phone]

## 2015-03-06 NOTE — ED Notes (Signed)
Pt states that she was leaving [employer] when she began having left arm weakness and left face sensory changes. Left arm grip weak and drift on assessment in triage. Sensation intact.

## 2015-03-07 ENCOUNTER — Inpatient Hospital Stay (HOSPITAL_COMMUNITY): Payer: Medicare Other

## 2015-03-07 DIAGNOSIS — I6523 Occlusion and stenosis of bilateral carotid arteries: Secondary | ICD-10-CM | POA: Insufficient documentation

## 2015-03-07 DIAGNOSIS — I6789 Other cerebrovascular disease: Secondary | ICD-10-CM

## 2015-03-07 LAB — LIPID PANEL
CHOL/HDL RATIO: 2.8 ratio
CHOLESTEROL: 155 mg/dL (ref 0–200)
HDL: 55 mg/dL (ref >39)
LDL Cholesterol: 56 mg/dL (ref 0–99)
Triglycerides: 220 mg/dL — ABNORMAL HIGH (ref <150)
VLDL: 44 mg/dL — ABNORMAL HIGH (ref 0–40)

## 2015-03-07 LAB — CBC
HCT: 33.2 % — ABNORMAL LOW (ref 36.0–46.0)
Hemoglobin: 10.8 g/dL — ABNORMAL LOW (ref 12.0–15.0)
MCH: 28.6 pg (ref 26.0–34.0)
MCHC: 32.5 g/dL (ref 30.0–36.0)
MCV: 87.8 fL (ref 78.0–100.0)
Platelets: 218 10*3/uL (ref 150–400)
RBC: 3.78 MIL/uL — ABNORMAL LOW (ref 3.87–5.11)
RDW: 14.5 % (ref 11.5–15.5)
WBC: 7.4 10*3/uL (ref 4.0–10.5)

## 2015-03-07 LAB — BASIC METABOLIC PANEL
Anion gap: 8 (ref 5–15)
BUN: 19 mg/dL (ref 6–23)
CHLORIDE: 107 mmol/L (ref 96–112)
CO2: 25 mmol/L (ref 19–32)
Calcium: 8.6 mg/dL (ref 8.4–10.5)
Creatinine, Ser: 1.08 mg/dL (ref 0.50–1.10)
GFR calc Af Amer: 54 mL/min — ABNORMAL LOW (ref >90)
GFR calc non Af Amer: 46 mL/min — ABNORMAL LOW (ref >90)
GLUCOSE: 143 mg/dL — AB (ref 70–99)
Potassium: 3.4 mmol/L — ABNORMAL LOW (ref 3.5–5.1)
SODIUM: 140 mmol/L (ref 135–145)

## 2015-03-07 MED ORDER — RANITIDINE HCL 150 MG/10ML PO SYRP
150.0000 mg | ORAL_SOLUTION | Freq: Two times a day (BID) | ORAL | Status: DC
  Administered 2015-03-07 – 2015-03-09 (×5): 150 mg via ORAL
  Filled 2015-03-07 (×6): qty 10

## 2015-03-07 NOTE — Evaluation (Signed)
Physical Therapy Evaluation Patient Details Name: [name] MRN: [id number] DOB: 1931/06/22 Today's Date: 03/07/2015   History of Present Illness   81 y.o. female admitted with sudden L arm weakness and numbness. CT (+) R postcentral gyrus and R frontal gyrus CVA PMH:  hypertension, hyperlipidemia, CKD stage 2-3, primary parathyroidism (status post parathyroid resection in 2010), history of breast cancer and mild tobacco abuse  Clinical Impression  Pt moving well and per pt seems to be back to her baseline except for L hand weakness and decreased coordination.  Pt will have family A as needed at home and anticipate no further PT needs at this time.  Will sign off.      Follow Up Recommendations No PT follow up;Supervision - Intermittent    Equipment Recommendations  None recommended by PT    Recommendations for Other Services       Precautions / Restrictions Precautions Precautions: None Restrictions Weight Bearing Restrictions: No      Mobility  Bed Mobility               General bed mobility comments: in chair on arrrival  Transfers Overall transfer level: Modified independent   Transfers: Sit to/from Stand Sit to Stand: Modified independent (Device/Increase time)            Ambulation/Gait Ambulation/Gait assistance: Modified independent (Device/Increase time) Ambulation Distance (Feet): 250 Feet Assistive device: None Gait Pattern/deviations: Step-through pattern;Decreased stride length     General Gait Details: pt moves well and without deficit.  Per pt moving at baseline level.    Stairs Stairs: Yes Stairs assistance: Modified independent (Device/Increase time) Stair Management: Two rails;Alternating pattern;Forwards Number of Stairs: 5 General stair comments: pt demos good safety.    Wheelchair Mobility    Modified Rankin (Stroke Patients Only) Modified Rankin (Stroke Patients Only) Pre-Morbid Rankin Score: No significant  disability Modified Rankin: Moderate disability     Balance Overall balance assessment: Modified Independent                                           Pertinent Vitals/Pain Pain Assessment: No/denies pain    Home Living Family/patient expects to be discharged to:: Private residence Living Arrangements: Children Available Help at Discharge: Family Type of Home: House Home Access: Stairs to enter Entrance Stairs-Rails: Right;Left;Can reach both Technical brewer of Steps: 3 Home Layout: One level Home Equipment: None      Prior Function Level of Independence: Independent               Hand Dominance   Dominant Hand: Right    Extremity/Trunk Assessment   Upper Extremity Assessment: Defer to OT evaluation       LUE Deficits / Details: 4 out 5 fine motor deficits. Provided theraputty yellow with handout and return demo from patient. Pt to complete x3 per day 5 minutes at a time   Lower Extremity Assessment: Overall WFL for tasks assessed      Cervical / Trunk Assessment: Normal  Communication   Communication: No difficulties  Cognition Arousal/Alertness: Awake/alert Behavior During Therapy: WFL for tasks assessed/performed Overall Cognitive Status: Within Functional Limits for tasks assessed                      General Comments      Exercises        Assessment/Plan    PT  Assessment Patent does not need any further PT services  PT Diagnosis Difficulty walking   PT Problem List    PT Treatment Interventions     PT Goals (Current goals can be found in the Care Plan section) Acute Rehab PT Goals Patient Stated Goal: Home PT Goal Formulation: All assessment and education complete, DC therapy    Frequency     Barriers to discharge        Co-evaluation               End of Session   Activity Tolerance: Patient tolerated treatment well Patient left: in chair;with call bell/phone within reach Nurse  Communication: Mobility status         Time: 2956-2130 PT Time Calculation (min) (ACUTE ONLY): 14 min   Charges:   PT Evaluation $Initial PT Evaluation Tier I: 1 Procedure     PT G CodesCatarina Hartshorn, [place] 03/07/2015, 2:29 PM

## 2015-03-07 NOTE — Care Management Note (Signed)
Case Management Note  Patient Details  Name: [name] MRN: [id number] Date of Birth: [birth year]  Subjective/Objective:        ADMITTED WITH STROKE            Action/Plan: CM FOLLOWING FOR DCP AWAITING FOR PT/OT EVALS FOR DISPOSITION NEEDS  Expected Discharge Date:      03/10/2015            Expected Discharge Plan:   POSSIBLE SNF PLACEMENT DEPENDING ON PT/OT EVALS   Status of Service:    IN PROGRESS   Additional Comments:  Royston Bake, RN 03/07/2015, 11:20 AM

## 2015-03-07 NOTE — Evaluation (Signed)
Occupational Therapy Evaluation/discharge Patient Details Name: [name] MRN: [id number] DOB: 01-May-1931 Today's Date: 03/07/2015    History of Present Illness  81 y.o. female admitted with sudden L arm weakness and numbness. CT (+) R postcentral gyrus and R frontal gyrus CVA PMH:  hypertension, hyperlipidemia, CKD stage 2-3, primary parathyroidism (status post parathyroid resection in 2010), history of breast cancer and mild tobacco abuse   Clinical Impression   Patient evaluated by Occupational Therapy with no further acute OT needs identified. All education has been completed and the patient has no further questions. See below for any follow-up Occupational Therapy or equipment needs. OT to sign off. Thank you for referral.      Follow Up Recommendations  No OT follow up    Equipment Recommendations  None recommended by OT    Recommendations for Other Services       Precautions / Restrictions Precautions Precautions: None      Mobility Bed Mobility               General bed mobility comments: in chair on arrrival  Transfers Overall transfer level: Needs assistance   Transfers: Sit to/from Stand Sit to Stand: Modified independent (Device/Increase time)              Balance                                            ADL Overall ADL's : At baseline                                             Vision Vision Assessment?: No apparent visual deficits   Perception     Praxis      Pertinent Vitals/Pain Pain Assessment: No/denies pain     Hand Dominance Right   Extremity/Trunk Assessment Upper Extremity Assessment Upper Extremity Assessment: LUE deficits/detail LUE Deficits / Details: 4 out 5 fine motor deficits. Provided theraputty yellow with handout and return demo from patient. Pt to complete x3 per day 5 minutes at a time LUE Sensation:  (reports sensation same bil)   Lower Extremity Assessment Lower  Extremity Assessment: Defer to PT evaluation   Cervical / Trunk Assessment Cervical / Trunk Assessment: Normal   Communication Communication Communication: No difficulties   Cognition Arousal/Alertness: Awake/alert Behavior During Therapy: WFL for tasks assessed/performed Overall Cognitive Status: Within Functional Limits for tasks assessed                     General Comments       Exercises       Shoulder Instructions      Home Living Family/patient expects to be discharged to:: Private residence Living Arrangements: Children Available Help at Discharge: Family               Bathroom Shower/Tub: Walk-in Psychologist, prison and probation services: Standard     Home Equipment: None          Prior Functioning/Environment Level of Independence: Independent             OT Diagnosis:     OT Problem List:     OT Treatment/Interventions:      OT Goals(Current goals can be found in the care plan section)  OT Frequency:     Barriers to D/C:            Co-evaluation              End of Session Nurse Communication: Mobility status;Precautions  Activity Tolerance: Patient tolerated treatment well Patient left: in chair;with call bell/phone within reach;with family/visitor present   Time: 1102-1117 OT Time Calculation (min): 23 min Charges:  OT General Charges $OT Visit: 1 Procedure OT Evaluation $Initial OT Evaluation Tier I: 1 Procedure G-Codes:    Peri Maris Mar 26, 2015, 12:30 PM  Pager: [phone]

## 2015-03-07 NOTE — Progress Notes (Signed)
[facility] PROGRESS NOTE  JAIYA MOORADIAN HCW:[id number] DOB: 1931-09-22 DOA: 03/06/2015 PCP: Haywood Pao, MD  Assessment/Plan: 1. Acute right MCA territory infarct. -Patient presenting with complaints of left arm weakness/numbness with brain imaging showing the presence of an acute infarct in the upper right MCA territory. Further workup revealed a 80% internal carotid artery stenosis. Patient had been enrolled in the Point Trial comparing aspirin/placebo with aspirin/Plavix.  -Neurology following -Will consult vascular surgery regarding carotid artery stenosis. -Continue statin therapy, along with physical therapy/occupational therapy  2.  Internal carotid artery stenosis. -Carotid Dopplers revealing a right greater than 80% internal carotid artery stenosis.  -Will consult vascular surgery  3.  Hypertension. -Continue metoprolol 50 mg by mouth daily along with Cozaar 25 mg by mouth daily  4.  Dyslipidemia. -Continue statin therapy  5.  DVT prophylaxis. SCDs  Code Status: Full code Family Communication: Spoke with her daughter who was present at bedside Disposition Plan:    Consultants:  Neurology  Vascular surgery    HPI/Subjective: Patient is a pleasant 81 year old female with a past medical history of hypertension, dyslipidemia, stage II chronic kidney disease admitted to medicine service on 03/06/2015 presented with complaints of left arm weakness and numbness. CT scan of brain without contrast performed on admission revealed an acute nonhemorrhagic infarct involving right postcentral gyrus and right prefrontal gyrus. Further workup included an MRI of the brain that showed patchy acute infarct in the upper right MCA territory with scattered petechial hemorrhage. Patient was evaluated by the stroke team. She is enrolled in the [place]. Carotid Dopplers revealed greater than 80% internal carotid artery stenosis.   Objective: Filed Vitals:   03/07/15 1739   BP: 150/65  Pulse: 78  Temp: 98.3 F (36.8 C)  Resp: 20   No intake or output data in the 24 hours ending 03/07/15 1746 Filed Weights   03/06/15 1734  Weight: 55.747 kg (122 lb 14.4 oz)    Exam:   General:  Patient is sitting up at bedside chair, no acute distress  Cardiovascular: Regular rate and rhythm normal S1-S2 no murmurs rubs or gallops  Respiratory: Lungs are clear to auscultation bilaterally no wheezing rhonchi or rales  Abdomen: Soft nontender nondistended positive bowel sounds  Musculoskeletal: No edema  Neurological: Patient having 3-5 muscle strength to left upper extremity, no facial droop or slurred speech, 5 of 5 muscle strength to bilateral extended.   Data Reviewed: Basic Metabolic Panel:  Recent Labs Lab 03/06/15 1545 03/06/15 1556 03/06/15 1920 03/07/15 0407  NA 142 143  --  140  K 3.8 3.7  --  3.4*  CL 107 106  --  107  CO2 24  --   --  25  GLUCOSE 108* 106*  --  143*  BUN 18 19  --  19  CREATININE 1.24* 1.20*  --  1.08  CALCIUM 9.4  --   --  8.6  PHOS  --   --  3.3  --    Liver Function Tests:  Recent Labs Lab 03/06/15 1545  AST 21  ALT 16  ALKPHOS 89  BILITOT 0.5  PROT 7.3  ALBUMIN 3.7   No results for input(s): LIPASE, AMYLASE in the last 168 hours. No results for input(s): AMMONIA in the last 168 hours. CBC:  Recent Labs Lab 03/06/15 1545 03/06/15 1556 03/07/15 0407  WBC 10.4  --  7.4  NEUTROABS 7.3  --   --   HGB 12.1 13.3 10.8*  HCT 37.1 39.0  33.2*  MCV 87.1  --  87.8  PLT 241  --  218   Cardiac Enzymes: No results for input(s): CKTOTAL, CKMB, CKMBINDEX, TROPONINI in the last 168 hours. BNP (last 3 results) No results for input(s): BNP in the last 8760 hours.  ProBNP (last 3 results) No results for input(s): PROBNP in the last 8760 hours.  CBG:  Recent Labs Lab 03/06/15 1544  GLUCAP 100*    No results found for this or any previous visit (from the past 240 hour(s)).   Studies: Ct Head Wo  Contrast  03/06/2015   CLINICAL DATA:  Left-sided weakness.  EXAM: CT HEAD WITHOUT CONTRAST  TECHNIQUE: Contiguous axial images were obtained from the base of the skull through the vertex without intravenous contrast.  COMPARISON:  None.  FINDINGS: Foci of hypoattenuation are noted within the post central gyrus and pre frontal gyrus on the right the basal ganglia are intact. The insular ribbon is intact. Cerebellum is unremarkable.  No acute hemorrhage or mass lesion is present.  The paranasal sinuses and mastoid air cells are clear. The calvarium is intact. Vascular calcifications are present within the cavernous internal carotid arteries and at the dural margin of the vertebral arteries. The patient is status post bilateral lens replacements. The globes and orbits are otherwise intact.  ASPECTS score = 8/10  [place] Stroke Program Early CT Score  Normal score = 10  IMPRESSION: 1. Acute nonhemorrhagic infarcts involving the right postcentral gyrus and right pre frontal gyrus. 2. Atherosclerosis. 3. Aspects score is 8/10 These results were called by telephone at the time of interpretation on 03/06/2015 at 3:53 Pm to Dr. Wallie Char , who verbally acknowledged these results.   Electronically Signed   By: San Morelle M.D.   On: 03/06/2015 16:28   Mr Brain Wo Contrast  03/07/2015   CLINICAL DATA:  Sudden onset left arm weakness and decreased sensation  EXAM: MRI HEAD WITHOUT CONTRAST  MRA HEAD WITHOUT CONTRAST  TECHNIQUE: Multiplanar, multiecho pulse sequences of the brain and surrounding structures were obtained without intravenous contrast. Angiographic images of the head were obtained using MRA technique without contrast.  COMPARISON:  Head CT from 1 day prior  FINDINGS: MRI HEAD FINDINGS  Calvarium and upper cervical spine: No marrow signal abnormality.  Orbits: Bilateral cataract resection.  Sinuses: Clear. Mastoid and middle ears are clear.  Brain: Cortically based patchy restricted diffusion  throughout the upper right cerebral convexity, upper division MCA territory predominantly. Infarcts extend from the subcortical anterior frontal lobe into the parietal and posterior temporal lobe. The largest discrete infarct is 21 mm. No acute infarct outside of this arterial distribution. Gradient hypo intensity patchy along the acute infarcts, consistent with petechial hemorrhage. There is no measurable hematoma.  Cerebral volume is excellent for age. There is a remote small vessel infarct in the left pons  Extra-axial T2 hypo intense mass along the mid sylvian fissure on the right measuring 11 mm. Appearance is most consistent with a meningioma, but noted history of breast cancer.  No hydrocephalus.  MRA HEAD FINDINGS  Balanced vertebral arteries. The basilar is normal. The AICA is dominant on the right. The AICA and PICA are symmetric on the left and appear normal. Superior cerebellar arteries are symmetric and normal. Moderate stenosis of right P3 branches proximally. No evidence of aneurysm or major branch occlusion.  Balanced carotid arteries. An anterior communicating artery is present. Posterior communicating arteries are not visible.  There is mild luminal irregularity of the cavernous  carotids consistent with atherosclerosis. No major vessel stenosis. No definitive branch occlusion. No aneurysm or evidence of vascular malformation.  IMPRESSION: 1. Patchy acute infarct in the upper right MCA territory with scattered petechial hemorrhage. The volume of infarct is grossly stable from prior head CT. No treatable intracranial stenosis or branch occlusion. 2. Mild for age intracranial atherosclerosis, best visualized in the cavernous carotids and distal right PCA branches. 3. 11 mm extra-axial mass along the right cerebral convexity, appearance consistent with meningioma. In this patient with history breast cancer, 1 followup at 6 months is recommended to document stability.   Electronically Signed   By:  Monte Fantasia M.D.   On: 03/07/2015 08:22   [name] Head/brain Wo Cm  03/07/2015   CLINICAL DATA:  Sudden onset left arm weakness and decreased sensation  EXAM: MRI HEAD WITHOUT CONTRAST  MRA HEAD WITHOUT CONTRAST  TECHNIQUE: Multiplanar, multiecho pulse sequences of the brain and surrounding structures were obtained without intravenous contrast. Angiographic images of the head were obtained using MRA technique without contrast.  COMPARISON:  Head CT from 1 day prior  FINDINGS: MRI HEAD FINDINGS  Calvarium and upper cervical spine: No marrow signal abnormality.  Orbits: Bilateral cataract resection.  Sinuses: Clear. Mastoid and middle ears are clear.  Brain: Cortically based patchy restricted diffusion throughout the upper right cerebral convexity, upper division MCA territory predominantly. Infarcts extend from the subcortical anterior frontal lobe into the parietal and posterior temporal lobe. The largest discrete infarct is 21 mm. No acute infarct outside of this arterial distribution. Gradient hypo intensity patchy along the acute infarcts, consistent with petechial hemorrhage. There is no measurable hematoma.  Cerebral volume is excellent for age. There is a remote small vessel infarct in the left pons  Extra-axial T2 hypo intense mass along the mid sylvian fissure on the right measuring 11 mm. Appearance is most consistent with a meningioma, but noted history of breast cancer.  No hydrocephalus.  MRA HEAD FINDINGS  Balanced vertebral arteries. The basilar is normal. The AICA is dominant on the right. The AICA and PICA are symmetric on the left and appear normal. Superior cerebellar arteries are symmetric and normal. Moderate stenosis of right P3 branches proximally. No evidence of aneurysm or major branch occlusion.  Balanced carotid arteries. An anterior communicating artery is present. Posterior communicating arteries are not visible.  There is mild luminal irregularity of the cavernous carotids  consistent with atherosclerosis. No major vessel stenosis. No definitive branch occlusion. No aneurysm or evidence of vascular malformation.  IMPRESSION: 1. Patchy acute infarct in the upper right MCA territory with scattered petechial hemorrhage. The volume of infarct is grossly stable from prior head CT. No treatable intracranial stenosis or branch occlusion. 2. Mild for age intracranial atherosclerosis, best visualized in the cavernous carotids and distal right PCA branches. 3. 11 mm extra-axial mass along the right cerebral convexity, appearance consistent with meningioma. In this patient with history breast cancer, 1 followup at 6 months is recommended to document stability.   Electronically Signed   By: Monte Fantasia M.D.   On: 03/07/2015 08:22    Scheduled Meds: .  stroke: mapping our early stages of recovery book   Does not apply Once  . aspirin EC  81 mg Oral Daily  . cholecalciferol  400 Units Oral Daily  . felodipine  10 mg Oral Daily  . ferrous sulfate  325 mg Oral Q breakfast  . losartan  25 mg Oral Daily  . metoprolol  50 mg  Oral Daily  . research study medication  75 mg Oral Q breakfast  . potassium chloride SA  20 mEq Oral BID  . ranitidine  150 mg Oral BID  . rosuvastatin  20 mg Oral Daily   Continuous Infusions:   Principal Problem:   Stroke Active Problems:   High blood pressure   Breast cancer   Hyperparathyroidism, primary   HLD (hyperlipidemia)   GERD without esophagitis   Stroke with cerebral ischemia    Time spent: 25 min    Kelvin Cellar  [facility] Pager [phone]. If 7PM-7AM, please contact night-coverage at [email or web address], password [facility] 03/07/2015, 5:46 PM  LOS: 1 day

## 2015-03-07 NOTE — Progress Notes (Signed)
Research Study Note  Patient is interested to enroll in [place] after telephonic discussion about the study and discussion of risk/benefit/ Patient given opportunity to answer questions which were answered. Patient understood that participation is voluntary.Research coordinator to give patient consent form to read and ask questions and go over inclusion/exclusion criteria  Antony Contras, MD

## 2015-03-07 NOTE — Progress Notes (Signed)
VASCULAR LAB PRELIMINARY  PRELIMINARY  PRELIMINARY  PRELIMINARY  Carotid duplex  completed.    Preliminary report:  Right:  Greater than 80% internal carotid artery stenosis.  Left:  1-39% ICA stenosis.  Bilateral:  Vertebral artery flow is antegrade.     Gurnoor Ursua, RVT 03/07/2015, 4:35 PM

## 2015-03-07 NOTE — Progress Notes (Signed)
  Echocardiogram 2D Echocardiogram has been performed.  [name] 03/07/2015, 2:59 PM

## 2015-03-07 NOTE — Progress Notes (Signed)
STROKE TEAM PROGRESS NOTE   HISTORY [name] is an 81 y.o. female who was at [place] when she had a sudden onset of left arm weakness and decreased sensation (LKW 03/06/2015 at 1315). On arrival to ED she started to have symptoms resolve. While in CT her symptoms fully resolved. At present time she feels back to her baseline. Ct head showed no acute stroke, bleed or mass. Current exam shows only dysmetria of left arm. Modified Rankin: Rankin Score=0. Patient was not administered TPA secondary to symptoms resolved. She was admitted for further evaluation and treatment.   SUBJECTIVE (INTERVAL HISTORY)   OBJECTIVE Temp:  [97.9 F (36.6 C)-98.9 F (37.2 C)] 98 F (36.7 C) (04/27 1344) Pulse Rate:  [69-90] 69 (04/27 1344) Cardiac Rhythm:  [-] Normal sinus rhythm (04/26 1930) Resp:  [13-20] 20 (04/27 1344) BP: (119-177)/(46-105) 119/46 mmHg (04/27 1344) SpO2:  [97 %-100 %] 100 % (04/27 1344) Weight:  [55.747 kg (122 lb 14.4 oz)] 55.747 kg (122 lb 14.4 oz) (04/26 1734)   Recent Labs Lab 03/06/15 1544  GLUCAP 100*    Recent Labs Lab 03/06/15 1545 03/06/15 1556 03/06/15 1920 03/07/15 0407  NA 142 143  --  140  K 3.8 3.7  --  3.4*  CL 107 106  --  107  CO2 24  --   --  25  GLUCOSE 108* 106*  --  143*  BUN 18 19  --  19  CREATININE 1.24* 1.20*  --  1.08  CALCIUM 9.4  --   --  8.6  PHOS  --   --  3.3  --     Recent Labs Lab 03/06/15 1545  AST 21  ALT 16  ALKPHOS 89  BILITOT 0.5  PROT 7.3  ALBUMIN 3.7    Recent Labs Lab 03/06/15 1545 03/06/15 1556 03/07/15 0407  WBC 10.4  --  7.4  NEUTROABS 7.3  --   --   HGB 12.1 13.3 10.8*  HCT 37.1 39.0 33.2*  MCV 87.1  --  87.8  PLT 241  --  218   No results for input(s): CKTOTAL, CKMB, CKMBINDEX, TROPONINI in the last 168 hours.  Recent Labs  03/06/15 1545  LABPROT 13.2  INR 0.99    Recent Labs  03/06/15 1643  COLORURINE YELLOW  LABSPEC 1.010  PHURINE 5.5  GLUCOSEU NEGATIVE  HGBUR NEGATIVE   BILIRUBINUR NEGATIVE  KETONESUR NEGATIVE  PROTEINUR 30*  UROBILINOGEN 0.2  NITRITE NEGATIVE  LEUKOCYTESUR MODERATE*       Component Value Date/Time   CHOL 155 03/07/2015 0357   TRIG 220* 03/07/2015 0357   HDL 55 03/07/2015 0357   CHOLHDL 2.8 03/07/2015 0357   VLDL 44* 03/07/2015 0357   LDLCALC 56 03/07/2015 0357   No results found for: HGBA1C    Component Value Date/Time   LABOPIA NONE DETECTED 03/06/2015 1643   COCAINSCRNUR NONE DETECTED 03/06/2015 1643   LABBENZ NONE DETECTED 03/06/2015 1643   AMPHETMU NONE DETECTED 03/06/2015 1643   THCU NONE DETECTED 03/06/2015 1643   LABBARB NONE DETECTED 03/06/2015 1643     Recent Labs Lab 03/06/15 1545  ETH <5   I have personally reviewed the radiological images below and agree with the radiology interpretations.  Ct Head Wo Contrast 03/06/2015    1. Acute nonhemorrhagic infarcts involving the right postcentral gyrus and right pre frontal gyrus. 2. Atherosclerosis. 3. Aspects score is 8/10   Mr Brain Wo Contrast 03/07/2015     - Patchy acute  infarct in the upper right MCA territory with scattered petechial hemorrhage. The volume of infarct is grossly stable from prior head CT.  - 11 mm extra-axial mass along the right cerebral convexity, appearance consistent with meningioma. In this patient with history breast cancer, 1 followup at 6 months is recommended to document stability.     [name] Head/brain Wo Cm 03/07/2015     No treatable intracranial stenosis or branch occlusion.  Mild for age intracranial atherosclerosis, best visualized in the cavernous carotids and distal right PCA branches.   Carotid Doppler  Right: Greater than 80% internal carotid artery stenosis. Left: 1-39% ICA stenosis. Bilateral: Vertebral artery flow is antegrade.   2D Echocardiogram   - Left ventricle: The cavity size was normal. Wall thickness wasincreased in a pattern of mild LVH. The estimated ejectionfraction was 65%. Wall motion was normal;  there were no regionalwall motion abnormalities. - Right ventricle: The cavity size was normal. Systolic functionwas normal. Impressions: No cardiac source of embolism was identified, butcannot be ruled out on the basis of this examination.   PHYSICAL EXAM  Temp:  [97.7 F (36.5 C)-98.8 F (37.1 C)] 97.7 F (36.5 C) (04/27 2207) Pulse Rate:  [69-83] 76 (04/27 2207) Resp:  [14-20] 20 (04/27 2207) BP: (119-177)/(46-83) 153/70 mmHg (04/27 2207) SpO2:  [98 %-100 %] 100 % (04/27 2207)  General - Well nourished, well developed, in no apparent distress.  Ophthalmologic - fundi not visualized due to incorporation.  Cardiovascular - Regular rate and rhythm with no murmur.  Mental Status -  Level of arousal and orientation to time, place, and person were intact. Language including expression, naming, repetition, comprehension was assessed and found intact. Fund of Knowledge was assessed and was intact.  Cranial Nerves II - XII - II - Visual field intact OU. III, IV, VI - Extraocular movements intact. V - Facial sensation intact bilaterally. VII - Facial movement intact bilaterally. VIII - Hearing & vestibular intact bilaterally. X - Palate elevates symmetrically. XI - Chin turning & shoulder shrug intact bilaterally. XII - Tongue protrusion intact.  Motor Strength - The patient's strength was normal in all extremities except left UE 4/5 proximal and 5-/5 distal and pronator drift was present on the left. Bulk was normal and fasciculations were absent.   Motor Tone - Muscle tone was assessed at the neck and appendages and was normal.  Reflexes - The patient's reflexes were 1+ in all extremities and she had no pathological reflexes.  Sensory - Light touch, temperature/pinprick were assessed and were symmetrical.    Coordination - The patient had normal movements in the hands and feet with no ataxia or dysmetria.  Tremor was absent.  Gait and Station - deferred due to safety  concerns   ASSESSMENT/PLAN [name] is a 81 y.o. female with history of hypertension, hyperlipidemia, CKD stage 2-3, primary parathyroidism (status post parathyroid resection in 2010), history of breast cancer and tobacco abuse presenting with Left arm weakness and decreased sensation. She did not receive IV t-PA due to resolved symptoms.   Stroke:  Non-dominant right MCA scattered embolic infarct, secondary to R ICA stenosis > 80%. Enrolled in the POINT Trial  Resultant  Left hemiparesis arm > leg  MRI  R MCA scattered infarct  MRA  No large vessel stenosis, but decreased flow on the right MCA indicating proximal stenosis.  Carotid Doppler  R > 80% ICA stenosis  2D Echo  No source of embolus   LDL 56  HgbA1c  pending  SCDs for VTE prophylaxis  Diet Heart Room service appropriate?: Yes; Fluid consistency:: Thin  plendil prior to admission, now on aspirin 81 mg orally every day along with investigational drugs.  Patient counseled to be compliant with her antithrombotic medications  Patient is enrollled in [place] is a randomized, double-blind, multicenter clinical trial to determine whether clopidogrel 75mg /day (after a loading dose of 600mg ) is effective in improving survival free from major ischemic vascular events (ischemic stroke, myocardial infarction, and ischemic vascular death) at 90 days when initiated within 12 hours time last known free of new ischemic symptoms of TIA or minor ischemic stroke in subjects receiving aspirin 50-325mg /day. Please contact [facility] at [phone] for any questions.   Ongoing aggressive stroke risk factor management  Therapy recommendations:  No PT or OT  Disposition:  Return home  Right ICA stenosis  Likely to explain pt embolic stroke right MCA  Recommend vascular surgery consult and consider right CEA  Investigational drug can be discontinued 5 days before the procedure and resumed after  the procedure.   Hypertension  Home med: losartan and metoprolol  Stable  Permissive hypertension (OK if <220/120) for 24-48 hours post stroke and then gradually normalized within 5-7 days.  Hyperlipidemia  Home meds:  crestor 20, resumed in hospital  LDL 56, goal < 70  Continue statin at discharge  Other Stroke Risk Factors  Advanced age  Current Cigarette smoker,  advised to stop smoking  ETOH use  Family hx stroke (father and mother)  Hospital day # [place] for Pager information 03/07/2015 7:56 PM   81 yo F with PMH of HTN, HLD was admitted for right MCA territory large stroke. CUS showed right ICA 80% stenosis. Recommend vascular surgery consult. Meanwhile, pt was enrolled into POINT trial and taking investigational drug along with baby ASA.   Rosalin Hawking, MD PhD Stroke Neurology 03/07/2015 11:21 PM   To contact Stroke Continuity provider, please refer to [email or web address] After hours, contact General Neurology

## 2015-03-07 NOTE — Progress Notes (Signed)
*  PRELIMINARY RESULTS* Echocardiogram 2D Echocardiogram has been performed.  Donata Clay 03/07/2015, 2:59 PM

## 2015-03-08 ENCOUNTER — Inpatient Hospital Stay (HOSPITAL_COMMUNITY): Payer: Medicare Other

## 2015-03-08 ENCOUNTER — Encounter (HOSPITAL_COMMUNITY): Payer: Self-pay

## 2015-03-08 DIAGNOSIS — I639 Cerebral infarction, unspecified: Secondary | ICD-10-CM | POA: Insufficient documentation

## 2015-03-08 DIAGNOSIS — I6521 Occlusion and stenosis of right carotid artery: Secondary | ICD-10-CM

## 2015-03-08 DIAGNOSIS — I63131 Cerebral infarction due to embolism of right carotid artery: Secondary | ICD-10-CM

## 2015-03-08 LAB — HEMOGLOBIN A1C
HEMOGLOBIN A1C: 6 % — AB (ref 4.8–5.6)
MEAN PLASMA GLUCOSE: 126 mg/dL

## 2015-03-08 LAB — CBC
HCT: 32.9 % — ABNORMAL LOW (ref 36.0–46.0)
HEMOGLOBIN: 10.6 g/dL — AB (ref 12.0–15.0)
MCH: 28.8 pg (ref 26.0–34.0)
MCHC: 32.2 g/dL (ref 30.0–36.0)
MCV: 89.4 fL (ref 78.0–100.0)
PLATELETS: 218 10*3/uL (ref 150–400)
RBC: 3.68 MIL/uL — ABNORMAL LOW (ref 3.87–5.11)
RDW: 14.4 % (ref 11.5–15.5)
WBC: 8.1 10*3/uL (ref 4.0–10.5)

## 2015-03-08 LAB — BASIC METABOLIC PANEL
ANION GAP: 10 (ref 5–15)
BUN: 14 mg/dL (ref 6–23)
CALCIUM: 8.4 mg/dL (ref 8.4–10.5)
CO2: 24 mmol/L (ref 19–32)
CREATININE: 1.11 mg/dL — AB (ref 0.50–1.10)
Chloride: 109 mmol/L (ref 96–112)
GFR calc non Af Amer: 45 mL/min — ABNORMAL LOW (ref >90)
GFR, EST AFRICAN AMERICAN: 52 mL/min — AB (ref >90)
Glucose, Bld: 117 mg/dL — ABNORMAL HIGH (ref 70–99)
Potassium: 3.7 mmol/L (ref 3.5–5.1)
Sodium: 143 mmol/L (ref 135–145)

## 2015-03-08 MED ORDER — IOHEXOL 350 MG/ML SOLN
80.0000 mL | Freq: Once | INTRAVENOUS | Status: AC | PRN
  Administered 2015-03-08: 80 mL via INTRAVENOUS

## 2015-03-08 NOTE — Progress Notes (Signed)
[facility] PROGRESS NOTE  [name]:[id number] DOB: Nov 11, 1930 DOA: 03/06/2015 PCP: Haywood Pao, MD  Interim Summary Patient is a pleasant 81 year old female with a past medical history of hypertension, dyslipidemia, stage II chronic kidney disease admitted to medicine service on 03/06/2015 presented with complaints of left arm weakness and numbness. CT scan of brain without contrast performed on admission revealed an acute nonhemorrhagic infarct involving right postcentral gyrus and right prefrontal gyrus. Further workup included an MRI of the brain that showed patchy acute infarct in the upper right MCA territory with scattered petechial hemorrhage. Patient was evaluated by the stroke team. She is enrolled in the [place]. Carotid Dopplers revealed greater than 80% internal carotid artery stenosis.   Assessment/Plan: 1. Acute right MCA territory infarct. -Patient presenting with complaints of left arm weakness/numbness with brain imaging showing the presence of an acute infarct in the upper right MCA territory. Further workup revealed a 80% internal carotid artery stenosis. Patient had been enrolled in the Point Trial comparing aspirin/placebo with aspirin/Plavix.  -Neurology following -Consulted vascular surgery regarding carotid artery stenosis. -Continue statin therapy, along with physical therapy/occupational therapy  2.  Internal carotid artery stenosis. -Carotid Dopplers revealing a right greater than 80% internal carotid artery stenosis.  -Consulted vascular surgery  3.  Hypertension. -Continue metoprolol 50 mg by mouth daily along with Cozaar 25 mg by mouth daily  4.  Dyslipidemia. -Continue statin therapy  5.  DVT prophylaxis. SCDs  Code Status: Full code Family Communication: Spoke with her daughter who was present at bedside Disposition Plan:    Consultants:  Neurology  Vascular surgery    HPI/Subjective: Patient states feeling better with  increase strength to her left upper extremity. She has tolerated by mouth intake, and bleeding around her room.  Objective: Filed Vitals:   03/08/15 0915  BP: 143/55  Pulse: 71  Temp: 98.6 F (37 C)  Resp: 20   No intake or output data in the 24 hours ending 03/08/15 0924 Filed Weights   03/06/15 1734  Weight: 55.747 kg (122 lb 14.4 oz)    Exam:   General:  Patient is sitting up at bedside chair, no acute distress  Cardiovascular: Regular rate and rhythm normal S1-S2 no murmurs rubs or gallops  Respiratory: Lungs are clear to auscultation bilaterally no wheezing rhonchi or rales  Abdomen: Soft nontender nondistended positive bowel sounds  Musculoskeletal: No edema  Neurological: Patient having 4-5 muscle strength to left upper extremity, no facial droop or slurred speech, 5 of 5 muscle strength to bilateral extended. Overall improvement to neurological deficits.  Data Reviewed: Basic Metabolic Panel:  Recent Labs Lab 03/06/15 1545 03/06/15 1556 03/06/15 1920 03/07/15 0407 03/08/15 0450  NA 142 143  --  140 143  K 3.8 3.7  --  3.4* 3.7  CL 107 106  --  107 109  CO2 24  --   --  25 24  GLUCOSE 108* 106*  --  143* 117*  BUN 18 19  --  19 14  CREATININE 1.24* 1.20*  --  1.08 1.11*  CALCIUM 9.4  --   --  8.6 8.4  PHOS  --   --  3.3  --   --    Liver Function Tests:  Recent Labs Lab 03/06/15 1545  AST 21  ALT 16  ALKPHOS 89  BILITOT 0.5  PROT 7.3  ALBUMIN 3.7   No results for input(s): LIPASE, AMYLASE in the last 168 hours. No results for input(s): AMMONIA  in the last 168 hours. CBC:  Recent Labs Lab 03/06/15 1545 03/06/15 1556 03/07/15 0407 03/08/15 0450  WBC 10.4  --  7.4 8.1  NEUTROABS 7.3  --   --   --   HGB 12.1 13.3 10.8* 10.6*  HCT 37.1 39.0 33.2* 32.9*  MCV 87.1  --  87.8 89.4  PLT 241  --  218 218   Cardiac Enzymes: No results for input(s): CKTOTAL, CKMB, CKMBINDEX, TROPONINI in the last 168 hours. BNP (last 3 results) No results  for input(s): BNP in the last 8760 hours.  ProBNP (last 3 results) No results for input(s): PROBNP in the last 8760 hours.  CBG:  Recent Labs Lab 03/06/15 1544  GLUCAP 100*    No results found for this or any previous visit (from the past 240 hour(s)).   Studies: Ct Head Wo Contrast  03/06/2015   CLINICAL DATA:  Left-sided weakness.  EXAM: CT HEAD WITHOUT CONTRAST  TECHNIQUE: Contiguous axial images were obtained from the base of the skull through the vertex without intravenous contrast.  COMPARISON:  None.  FINDINGS: Foci of hypoattenuation are noted within the post central gyrus and pre frontal gyrus on the right the basal ganglia are intact. The insular ribbon is intact. Cerebellum is unremarkable.  No acute hemorrhage or mass lesion is present.  The paranasal sinuses and mastoid air cells are clear. The calvarium is intact. Vascular calcifications are present within the cavernous internal carotid arteries and at the dural margin of the vertebral arteries. The patient is status post bilateral lens replacements. The globes and orbits are otherwise intact.  ASPECTS score = 8/10  [place] Stroke Program Early CT Score  Normal score = 10  IMPRESSION: 1. Acute nonhemorrhagic infarcts involving the right postcentral gyrus and right pre frontal gyrus. 2. Atherosclerosis. 3. Aspects score is 8/10 These results were called by telephone at the time of interpretation on 03/06/2015 at 3:53 Pm to Dr. Wallie Char , who verbally acknowledged these results.   Electronically Signed   By: San Morelle M.D.   On: 03/06/2015 16:28   Mr Brain Wo Contrast  03/07/2015   CLINICAL DATA:  Sudden onset left arm weakness and decreased sensation  EXAM: MRI HEAD WITHOUT CONTRAST  MRA HEAD WITHOUT CONTRAST  TECHNIQUE: Multiplanar, multiecho pulse sequences of the brain and surrounding structures were obtained without intravenous contrast. Angiographic images of the head were obtained using MRA technique without  contrast.  COMPARISON:  Head CT from 1 day prior  FINDINGS: MRI HEAD FINDINGS  Calvarium and upper cervical spine: No marrow signal abnormality.  Orbits: Bilateral cataract resection.  Sinuses: Clear. Mastoid and middle ears are clear.  Brain: Cortically based patchy restricted diffusion throughout the upper right cerebral convexity, upper division MCA territory predominantly. Infarcts extend from the subcortical anterior frontal lobe into the parietal and posterior temporal lobe. The largest discrete infarct is 21 mm. No acute infarct outside of this arterial distribution. Gradient hypo intensity patchy along the acute infarcts, consistent with petechial hemorrhage. There is no measurable hematoma.  Cerebral volume is excellent for age. There is a remote small vessel infarct in the left pons  Extra-axial T2 hypo intense mass along the mid sylvian fissure on the right measuring 11 mm. Appearance is most consistent with a meningioma, but noted history of breast cancer.  No hydrocephalus.  MRA HEAD FINDINGS  Balanced vertebral arteries. The basilar is normal. The AICA is dominant on the right. The AICA and PICA are symmetric on the  left and appear normal. Superior cerebellar arteries are symmetric and normal. Moderate stenosis of right P3 branches proximally. No evidence of aneurysm or major branch occlusion.  Balanced carotid arteries. An anterior communicating artery is present. Posterior communicating arteries are not visible.  There is mild luminal irregularity of the cavernous carotids consistent with atherosclerosis. No major vessel stenosis. No definitive branch occlusion. No aneurysm or evidence of vascular malformation.  IMPRESSION: 1. Patchy acute infarct in the upper right MCA territory with scattered petechial hemorrhage. The volume of infarct is grossly stable from prior head CT. No treatable intracranial stenosis or branch occlusion. 2. Mild for age intracranial atherosclerosis, best visualized in the  cavernous carotids and distal right PCA branches. 3. 11 mm extra-axial mass along the right cerebral convexity, appearance consistent with meningioma. In this patient with history breast cancer, 1 followup at 6 months is recommended to document stability.   Electronically Signed   By: Monte Fantasia M.D.   On: 03/07/2015 08:22   [name] Head/brain Wo Cm  03/07/2015   CLINICAL DATA:  Sudden onset left arm weakness and decreased sensation  EXAM: MRI HEAD WITHOUT CONTRAST  MRA HEAD WITHOUT CONTRAST  TECHNIQUE: Multiplanar, multiecho pulse sequences of the brain and surrounding structures were obtained without intravenous contrast. Angiographic images of the head were obtained using MRA technique without contrast.  COMPARISON:  Head CT from 1 day prior  FINDINGS: MRI HEAD FINDINGS  Calvarium and upper cervical spine: No marrow signal abnormality.  Orbits: Bilateral cataract resection.  Sinuses: Clear. Mastoid and middle ears are clear.  Brain: Cortically based patchy restricted diffusion throughout the upper right cerebral convexity, upper division MCA territory predominantly. Infarcts extend from the subcortical anterior frontal lobe into the parietal and posterior temporal lobe. The largest discrete infarct is 21 mm. No acute infarct outside of this arterial distribution. Gradient hypo intensity patchy along the acute infarcts, consistent with petechial hemorrhage. There is no measurable hematoma.  Cerebral volume is excellent for age. There is a remote small vessel infarct in the left pons  Extra-axial T2 hypo intense mass along the mid sylvian fissure on the right measuring 11 mm. Appearance is most consistent with a meningioma, but noted history of breast cancer.  No hydrocephalus.  MRA HEAD FINDINGS  Balanced vertebral arteries. The basilar is normal. The AICA is dominant on the right. The AICA and PICA are symmetric on the left and appear normal. Superior cerebellar arteries are symmetric and normal. Moderate  stenosis of right P3 branches proximally. No evidence of aneurysm or major branch occlusion.  Balanced carotid arteries. An anterior communicating artery is present. Posterior communicating arteries are not visible.  There is mild luminal irregularity of the cavernous carotids consistent with atherosclerosis. No major vessel stenosis. No definitive branch occlusion. No aneurysm or evidence of vascular malformation.  IMPRESSION: 1. Patchy acute infarct in the upper right MCA territory with scattered petechial hemorrhage. The volume of infarct is grossly stable from prior head CT. No treatable intracranial stenosis or branch occlusion. 2. Mild for age intracranial atherosclerosis, best visualized in the cavernous carotids and distal right PCA branches. 3. 11 mm extra-axial mass along the right cerebral convexity, appearance consistent with meningioma. In this patient with history breast cancer, 1 followup at 6 months is recommended to document stability.   Electronically Signed   By: Monte Fantasia M.D.   On: 03/07/2015 08:22    Scheduled Meds: .  stroke: mapping our early stages of recovery book   Does not  apply Once  . aspirin EC  81 mg Oral Daily  . cholecalciferol  400 Units Oral Daily  . felodipine  10 mg Oral Daily  . ferrous sulfate  325 mg Oral Q breakfast  . losartan  25 mg Oral Daily  . metoprolol  50 mg Oral Daily  . research study medication  75 mg Oral Q breakfast  . potassium chloride SA  20 mEq Oral BID  . ranitidine  150 mg Oral BID  . rosuvastatin  20 mg Oral Daily   Continuous Infusions:   Principal Problem:   Stroke Active Problems:   High blood pressure   Breast cancer   Hyperparathyroidism, primary   HLD (hyperlipidemia)   GERD without esophagitis   Stroke with cerebral ischemia   Carotid stenosis    Time spent: 20 min    Kelvin Cellar  [facility] Pager [phone]. If 7PM-7AM, please contact night-coverage at [email or web address], password [facility] 03/08/2015,  9:24 AM  LOS: 2 days

## 2015-03-08 NOTE — Progress Notes (Signed)
STROKE TEAM PROGRESS NOTE   HISTORY PRITIKA ALVAREZ is an 81 y.o. female who was at [place] when she had a sudden onset of left arm weakness and decreased sensation (LKW 03/06/2015 at 1315). On arrival to ED she started to have symptoms resolve. While in CT her symptoms fully resolved. At present time she feels back to her baseline. Ct head showed no acute stroke, bleed or mass. Current exam shows only dysmetria of left arm. Modified Rankin: Rankin Score=0. Patient was not administered TPA secondary to symptoms resolved. She was admitted for further evaluation and treatment.   SUBJECTIVE (INTERVAL HISTORY) Family at bedside. VVS at bedside speaking with pt.   OBJECTIVE Temp:  [97.7 F (36.5 C)-98.6 F (37 C)] 98.6 F (37 C) (04/28 0915) Pulse Rate:  [69-78] 71 (04/28 0915) Cardiac Rhythm:  [-] Normal sinus rhythm (04/28 0800) Resp:  [20] 20 (04/28 0915) BP: (111-160)/(46-70) 143/55 mmHg (04/28 0915) SpO2:  [100 %] 100 % (04/28 0915)   Recent Labs Lab 03/06/15 1544  GLUCAP 100*    Recent Labs Lab 03/06/15 1545 03/06/15 1556 03/06/15 1920 03/07/15 0407 03/08/15 0450  NA 142 143  --  140 143  K 3.8 3.7  --  3.4* 3.7  CL 107 106  --  107 109  CO2 24  --   --  25 24  GLUCOSE 108* 106*  --  143* 117*  BUN 18 19  --  19 14  CREATININE 1.24* 1.20*  --  1.08 1.11*  CALCIUM 9.4  --   --  8.6 8.4  PHOS  --   --  3.3  --   --     Recent Labs Lab 03/06/15 1545  AST 21  ALT 16  ALKPHOS 89  BILITOT 0.5  PROT 7.3  ALBUMIN 3.7    Recent Labs Lab 03/06/15 1545 03/06/15 1556 03/07/15 0407 03/08/15 0450  WBC 10.4  --  7.4 8.1  NEUTROABS 7.3  --   --   --   HGB 12.1 13.3 10.8* 10.6*  HCT 37.1 39.0 33.2* 32.9*  MCV 87.1  --  87.8 89.4  PLT 241  --  218 218   No results for input(s): CKTOTAL, CKMB, CKMBINDEX, TROPONINI in the last 168 hours.  Recent Labs  03/06/15 1545  LABPROT 13.2  INR 0.99    Recent Labs  03/06/15 1643  COLORURINE YELLOW  LABSPEC 1.010   PHURINE 5.5  GLUCOSEU NEGATIVE  HGBUR NEGATIVE  BILIRUBINUR NEGATIVE  KETONESUR NEGATIVE  PROTEINUR 30*  UROBILINOGEN 0.2  NITRITE NEGATIVE  LEUKOCYTESUR MODERATE*       Component Value Date/Time   CHOL 155 03/07/2015 0357   TRIG 220* 03/07/2015 0357   HDL 55 03/07/2015 0357   CHOLHDL 2.8 03/07/2015 0357   VLDL 44* 03/07/2015 0357   LDLCALC 56 03/07/2015 0357   Lab Results  Component Value Date   HGBA1C 6.0* 03/07/2015      Component Value Date/Time   LABOPIA NONE DETECTED 03/06/2015 1643   COCAINSCRNUR NONE DETECTED 03/06/2015 1643   LABBENZ NONE DETECTED 03/06/2015 1643   AMPHETMU NONE DETECTED 03/06/2015 1643   THCU NONE DETECTED 03/06/2015 1643   LABBARB NONE DETECTED 03/06/2015 1643     Recent Labs Lab 03/06/15 1545  ETH <5   I have personally reviewed the radiological images below and agree with the radiology interpretations.  Ct Head Wo Contrast 03/06/2015    1. Acute nonhemorrhagic infarcts involving the right postcentral gyrus and right pre  frontal gyrus. 2. Atherosclerosis. 3. Aspects score is 8/10   Mr Brain Wo Contrast 03/07/2015     - Patchy acute infarct in the upper right MCA territory with scattered petechial hemorrhage. The volume of infarct is grossly stable from prior head CT.  - 11 mm extra-axial mass along the right cerebral convexity, appearance consistent with meningioma. In this patient with history breast cancer, 1 followup at 6 months is recommended to document stability.     [name] Head/brain Wo Cm 03/07/2015     No treatable intracranial stenosis or branch occlusion.  Mild for age intracranial atherosclerosis, best visualized in the cavernous carotids and distal right PCA branches.   Carotid Doppler  Right: Greater than 80% internal carotid artery stenosis. Left: 1-39% ICA stenosis. Bilateral: Vertebral artery flow is antegrade.   2D Echocardiogram   - Left ventricle: The cavity size was normal. Wall thickness wasincreased in a  pattern of mild LVH. The estimated ejectionfraction was 65%. Wall motion was normal; there were no regionalwall motion abnormalities. - Right ventricle: The cavity size was normal. Systolic functionwas normal. Impressions: No cardiac source of embolism was identified, butcannot be ruled out on the basis of this examination.  CTA head and neck - pending  PHYSICAL EXAM  Temp:  [97.7 F (36.5 C)-98.6 F (37 C)] 98.6 F (37 C) (04/28 0915) Pulse Rate:  [69-78] 71 (04/28 0915) Resp:  [20] 20 (04/28 0915) BP: (111-160)/(46-70) 143/55 mmHg (04/28 0915) SpO2:  [100 %] 100 % (04/28 0915)  General - Well nourished, well developed, in no apparent distress.  Ophthalmologic - fundi not visualized due to incorporation.  Cardiovascular - Regular rate and rhythm with no murmur.  Mental Status -  Level of arousal and orientation to time, place, and person were intact. Language including expression, naming, repetition, comprehension was assessed and found intact. Fund of Knowledge was assessed and was intact.  Cranial Nerves II - XII - II - Visual field intact OU. III, IV, VI - Extraocular movements intact. V - Facial sensation intact bilaterally. VII - Facial movement intact bilaterally. VIII - Hearing & vestibular intact bilaterally. X - Palate elevates symmetrically. XI - Chin turning & shoulder shrug intact bilaterally. XII - Tongue protrusion intact.  Motor Strength - The patient's strength was normal in all extremities except left UE 4/5 proximal and 5-/5 distal and pronator drift was present on the left. Bulk was normal and fasciculations were absent.   Motor Tone - Muscle tone was assessed at the neck and appendages and was normal.  Reflexes - The patient's reflexes were 1+ in all extremities and she had no pathological reflexes.  Sensory - Light touch, temperature/pinprick were assessed and were symmetrical.    Coordination - The patient had normal movements in the hands and  feet with no ataxia or dysmetria.  Tremor was absent.  Gait and Station - deferred due to safety concerns   ASSESSMENT/PLAN Ms. YAIZA PALAZZOLA is a 81 y.o. female with history of hypertension, hyperlipidemia, CKD stage 2-3, primary parathyroidism (status post parathyroid resection in 2010), history of breast cancer and tobacco abuse presenting with Left arm weakness and decreased sensation. She did not receive IV t-PA due to resolved symptoms.   Stroke:  Non-dominant right MCA scattered embolic infarct, secondary to R ICA stenosis > 80%. Enrolled in the [facility] Trial  Resultant  Left hemiparesis arm > leg  MRI  R MCA scattered infarct  MRA  No large vessel stenosis, but decreased flow on the  right MCA indicating proximal stenosis.  Carotid Doppler  R > 80% ICA stenosis.   CTA head and neck - pending  2D Echo  No source of embolus   LDL 56  HgbA1c 6.0  SCDs for VTE prophylaxis Diet Heart Room service appropriate?: Yes; Fluid consistency:: Thin  plendil prior to admission, now on aspirin 81 mg orally every day along with investigational drugs. Patient is enrollled in [place] is a randomized, double-blind, multicenter clinical trial to determine whether clopidogrel 75mg /day (after a loading dose of 600mg ) is effective in improving survival free from major ischemic vascular events (ischemic stroke, myocardial infarction, and ischemic vascular death) at 90 days when initiated within 12 hours time last known free of new ischemic symptoms of TIA or minor ischemic stroke in subjects receiving aspirin 50-325mg /day. Please contact [facility] at [phone] for any questions.   Ongoing aggressive stroke risk factor management  Therapy recommendations:  No PT or OT  Disposition:  Return home  Right ICA stenosis  Likely to explain pt embolic stroke right MCA  VVS consult under way - stent vs CEA   CTA head and neck - pending  POINT Investigational  drug can be discontinued 5 days before the procedure and resumed after the procedure.   Hypertension  Home med: losartan and metoprolol  Remains Stable Permissive hypertension (OK if <220/120) for 24-48 hours post stroke and then gradually normalized within 5-7 days.  Hyperlipidemia  Home meds:  crestor 20, resumed in hospital  LDL 56, goal < 70  Continue statin at discharge  Other Stroke Risk Factors  Advanced age  Current Cigarette smoker,  advised to stop smoking  ETOH use  Family hx stroke (father and mother)  Hospital day # [place] for Pager information 03/08/2015 11:16 AM   I, the attending vascular neurologist, have personally obtained a history, examined the patient, evaluated laboratory data, individually viewed imaging studies and agree with radiology interpretations. I also discussed with Dr. Coralyn Pear regarding her care plan. Together with the NP/PA, we formulated the assessment and plan of care which reflects our mutual decision.  I have made any additions or clarifications directly to the above note and agree with the findings and plan as currently documented.   81 yo F with PMH of HTN, HLD was admitted for right MCA territory large stroke. CUS showed right ICA 80% stenosis. Recommend vascular surgery consult. Pending CTA head and neck. Meanwhile, pt was enrolled into POINT trial and taking investigational drug along with baby ASA. POINT Investigational drug can be discontinued 5 days before the procedure and resumed after the procedure.   Rosalin Hawking, MD PhD Stroke Neurology 03/08/2015 6:43 PM    To contact Stroke Continuity provider, please refer to [email or web address] After hours, contact General Neurology

## 2015-03-08 NOTE — Consult Note (Addendum)
CONSULT NOTE   MRN : [id number]  Reason for Consult: Carotid stenosis s/p acute R side stroke    History of Present Illness: 81 y/o female 03/06/2015 she was at [employer] and developed left arm weakness with decreased sensation.  Once she arrived in the ED her symptoms had improved.  Carotid duplex was ordered and revealed Right ICA > 80% stenosis and left ICA < 40% stenosis.  We are being consult for evaluation and treatment of carotid stenosis.   Patient has no prior stroke or TIA history.  She has never had amaurosis fugax or monocular blindness.  She has never previously had hemiplegia or facial drooping.  She also denies expressive or receptive aphasia.  The patient's atherosclerotic risks included: HTN, passive smoking exposure.  Past medical history: Hypertension treated with beta blocker.  She also takes a statin daily, and now has been started on an 81 mg aspirin daily.       Current Facility-Administered Medications  Medication Dose Route Frequency Provider Last Rate Last Dose  .  stroke: mapping our early stages of recovery book   Does not apply Once Barton Dubois, MD      . acetaminophen (TYLENOL) tablet 650 mg  650 mg Oral Q4H PRN Barton Dubois, MD       Or  . acetaminophen (TYLENOL) suppository 650 mg  650 mg Rectal Q4H PRN Barton Dubois, MD      . aspirin EC tablet 81 mg  81 mg Oral Daily Barton Dubois, MD   81 mg at 03/08/15 0943  . cholecalciferol (VITAMIN D) tablet 400 Units  400 Units Oral Daily Barton Dubois, MD   400 Units at 03/08/15 [phone]  . felodipine (PLENDIL) 24 hr tablet 10 mg  10 mg Oral Daily Barton Dubois, MD   10 mg at 03/08/15 0942  . ferrous sulfate tablet 325 mg  325 mg Oral Q breakfast Barton Dubois, MD   325 mg at 03/08/15 6967  . losartan (COZAAR) tablet 25 mg  25 mg Oral Daily Barton Dubois, MD   25 mg at 03/08/15 0943  . metoprolol (LOPRESSOR) tablet 50 mg  50 mg Oral Daily Barton Dubois, MD   50 mg at 03/08/15 0943  . POINT study - Placebo /  clopidogrel (daily dose)  (PI-Sethi)  75 mg Oral Q breakfast Barton Dubois, MD   75 mg at 03/08/15 8938  . potassium chloride SA (K-DUR,KLOR-CON) CR tablet 20 mEq  20 mEq Oral BID Barton Dubois, MD   20 mEq at 03/08/15 0943  . ranitidine (ZANTAC) 150 MG/10ML syrup 150 mg  150 mg Oral BID Garvin Fila, MD   150 mg at 03/08/15 0942  . rosuvastatin (CRESTOR) tablet 20 mg  20 mg Oral Daily Barton Dubois, MD   20 mg at 03/08/15 0942    Pt meds include: Statin :Yes Betablocker: Yes ASA: Yes Other anticoagulants/antiplatelets: none  Past Medical History  Diagnosis Date  . Hypertension   . Cataract   . Parathyroid disease     Clarify with patient. No detail indicated on history form.  . Cancer     breast    Past Surgical History  Procedure Laterality Date  . Parathyroidectomy  June 2010  . Breast lumpectomy  2008    Confirm type with patient. Not indicated on medical history form dated 12/26/10.    Social History History  Substance Use Topics  . Smoking status: Passive Smoke Exposure - Never Smoker  . Smokeless tobacco: Never Used  Comment: "When using the bathroom"  . Alcohol Use: Yes     Comment: Glass of wine on holidays.    Family History Family History  Problem Relation Age of Onset  . Stroke Mother   . Hypertension Mother   . Stroke Father     No Known Allergies   REVIEW OF SYSTEMS  General: [ ]  Weight loss, [ ]  Fever, [ ]  chills Neurologic: [ ]  Dizziness, [ ]  Blackouts, [ ]  Seizure, [ ]  Stroke, [ ]  "Mini stroke", [ ]  Slurred speech, [ ]  Temporary blindness; [x ] weakness in arms or legs, [ ]  Hoarseness [ ]  Dysphagia Cardiac: [ ]  Chest pain/pressure, [ ]  Shortness of breath at rest [ ]  Shortness of breath with exertion, [ ]  Atrial fibrillation or irregular heartbeat  Vascular: [ ]  Pain in legs with walking, [ ]  Pain in legs at rest, [ ]  Pain in legs at night,  [ ]  Non-healing ulcer, [ ]  Blood clot in vein/DVT,   Pulmonary: [ ]  Home oxygen, [ ]  Productive  cough, [ ]  Coughing up blood, [ ]  Asthma,  [ ]  Wheezing [ ]  COPD Musculoskeletal:  [ ]  Arthritis, [ ]  Low back pain, [ ]  Joint pain Hematologic: [ ]  Easy Bruising, [ ]  Anemia; [ ]  Hepatitis Gastrointestinal: [ ]  Blood in stool, [ ]  Gastroesophageal Reflux/heartburn, Urinary: [ ]  chronic Kidney disease, [ ]  on HD - [ ]  MWF or [ ]  TTHS, [ ]  Burning with urination, [ ]  Difficulty urinating Skin: [ ]  Rashes, [ ]  Wounds Psychological: [ ]  Anxiety, [ ]  Depression   Physical Examination Filed Vitals:   03/07/15 2207 03/08/15 0228 03/08/15 0615 03/08/15 0915  BP: 153/70 160/63 111/47 143/55  Pulse: 76 72 70 71  Temp: 97.7 F (36.5 C) 98 F (36.7 C) 98.3 F (36.8 C) 98.6 F (37 C)  TempSrc: Oral Oral Oral Oral  Resp: 20 20 20 20   Height:      Weight:      SpO2: 100% 100% 100% 100%   Body mass index is 22.47 kg/(m^2).   General: A&O x 3, WD, thin  Head: [place]/AT  Ear/Nose/Throat: Hearing grossly intact, nares w/o erythema or drainage, oropharynx w/o Erythema/Exudate, Mallampati score: 3  Eyes: PERRLA, EOMI  Neck: Supple, no nuchal rigidity, no palpable LAD  Pulmonary: Sym exp, good air movt, CTAB, no rales, rhonchi, & wheezing  Cardiac: RRR, Nl S1, S2, no Murmurs, rubs or gallops  Vascular: Vessel Right Left  Radial Palpable Palpable  Ulnar Palpable Palpable  Brachial Palpable Palpable  Carotid Palpable, without bruit Palpable, without bruit  Aorta Not palpable N/A  Femoral Palpable Palpable  Popliteal Not palpable Not palpable  PT Palpable Palpable  DP Faintly Palpable Faintlyable   Gastrointestinal: soft, NTND, -G/R, - HSM, - masses, - CVAT B  Musculoskeletal: M/S 5/5 throughout except RUE 3/5, extremities without ischemic changes   Neurologic: CN 2-12 intact , Pain and light touch intact in extremities , Motor exam as listed above  Psychiatric: Judgment intact, Mood & affect appropriate for pt's clinical situation  Dermatologic: See M/S exam for extremity exam, no  rashes otherwise noted  Lymph : No Cervical, Axillary, or Inguinal lymphadenopathy   Significant Diagnostic Studies: CBC Lab Results  Component Value Date   WBC 8.1 03/08/2015   HGB 10.6* 03/08/2015   HCT 32.9* 03/08/2015   MCV 89.4 03/08/2015   PLT 218 03/08/2015    BMET    Component Value Date/Time   NA 143 03/08/2015  0450   K 3.7 03/08/2015 0450   CL 109 03/08/2015 0450   CO2 24 03/08/2015 0450   GLUCOSE 117* 03/08/2015 0450   BUN 14 03/08/2015 0450   CREATININE 1.11* 03/08/2015 0450   CALCIUM 8.4 03/08/2015 0450   CALCIUM 9.6 04/10/2011 1042   GFRNONAA 45* 03/08/2015 0450   GFRAA 52* 03/08/2015 0450   Estimated Creatinine Clearance: 30.9 mL/min (by C-G formula based on Cr of 1.11).  COAG Lab Results  Component Value Date   INR 0.99 03/06/2015   INR 0.99 03/13/2011   Lipid Panel     Component Value Date/Time   CHOL 155 03/07/2015 0357   TRIG 220* 03/07/2015 0357   HDL 55 03/07/2015 0357   CHOLHDL 2.8 03/07/2015 0357   VLDL 44* 03/07/2015 0357   LDLCALC 56 03/07/2015 0357    Radiology: Ct Head Wo Contrast  03/06/2015   CLINICAL DATA:  Left-sided weakness.  EXAM: CT HEAD WITHOUT CONTRAST  TECHNIQUE: Contiguous axial images were obtained from the base of the skull through the vertex without intravenous contrast.  COMPARISON:  None.  FINDINGS: Foci of hypoattenuation are noted within the post central gyrus and pre frontal gyrus on the right the basal ganglia are intact. The insular ribbon is intact. Cerebellum is unremarkable.  No acute hemorrhage or mass lesion is present.  The paranasal sinuses and mastoid air cells are clear. The calvarium is intact. Vascular calcifications are present within the cavernous internal carotid arteries and at the dural margin of the vertebral arteries. The patient is status post bilateral lens replacements. The globes and orbits are otherwise intact.  ASPECTS score = 8/10  [place] Stroke Program Early CT Score  Normal score = 10   IMPRESSION: 1. Acute nonhemorrhagic infarcts involving the right postcentral gyrus and right pre frontal gyrus. 2. Atherosclerosis. 3. Aspects score is 8/10 These results were called by telephone at the time of interpretation on 03/06/2015 at 3:53 Pm to Dr. Wallie Char , who verbally acknowledged these results.   Electronically Signed   By: San Morelle M.D.   On: 03/06/2015 16:28   Mr Brain Wo Contrast  03/07/2015   CLINICAL DATA:  Sudden onset left arm weakness and decreased sensation  EXAM: MRI HEAD WITHOUT CONTRAST  MRA HEAD WITHOUT CONTRAST  TECHNIQUE: Multiplanar, multiecho pulse sequences of the brain and surrounding structures were obtained without intravenous contrast. Angiographic images of the head were obtained using MRA technique without contrast.  COMPARISON:  Head CT from 1 day prior  FINDINGS: MRI HEAD FINDINGS  Calvarium and upper cervical spine: No marrow signal abnormality.  Orbits: Bilateral cataract resection.  Sinuses: Clear. Mastoid and middle ears are clear.  Brain: Cortically based patchy restricted diffusion throughout the upper right cerebral convexity, upper division MCA territory predominantly. Infarcts extend from the subcortical anterior frontal lobe into the parietal and posterior temporal lobe. The largest discrete infarct is 21 mm. No acute infarct outside of this arterial distribution. Gradient hypo intensity patchy along the acute infarcts, consistent with petechial hemorrhage. There is no measurable hematoma.  Cerebral volume is excellent for age. There is a remote small vessel infarct in the left pons  Extra-axial T2 hypo intense mass along the mid sylvian fissure on the right measuring 11 mm. Appearance is most consistent with a meningioma, but noted history of breast cancer.  No hydrocephalus.  MRA HEAD FINDINGS  Balanced vertebral arteries. The basilar is normal. The AICA is dominant on the right. The AICA and PICA are symmetric on  the left and appear normal.  Superior cerebellar arteries are symmetric and normal. Moderate stenosis of right P3 branches proximally. No evidence of aneurysm or major branch occlusion.  Balanced carotid arteries. An anterior communicating artery is present. Posterior communicating arteries are not visible.  There is mild luminal irregularity of the cavernous carotids consistent with atherosclerosis. No major vessel stenosis. No definitive branch occlusion. No aneurysm or evidence of vascular malformation.  IMPRESSION: 1. Patchy acute infarct in the upper right MCA territory with scattered petechial hemorrhage. The volume of infarct is grossly stable from prior head CT. No treatable intracranial stenosis or branch occlusion. 2. Mild for age intracranial atherosclerosis, best visualized in the cavernous carotids and distal right PCA branches. 3. 11 mm extra-axial mass along the right cerebral convexity, appearance consistent with meningioma. In this patient with history breast cancer, 1 followup at 6 months is recommended to document stability.   Electronically Signed   By: Monte Fantasia M.D.   On: 03/07/2015 08:22   [name] Head/brain Wo Cm  03/07/2015   CLINICAL DATA:  Sudden onset left arm weakness and decreased sensation  EXAM: MRI HEAD WITHOUT CONTRAST  MRA HEAD WITHOUT CONTRAST  TECHNIQUE: Multiplanar, multiecho pulse sequences of the brain and surrounding structures were obtained without intravenous contrast. Angiographic images of the head were obtained using MRA technique without contrast.  COMPARISON:  Head CT from 1 day prior  FINDINGS: MRI HEAD FINDINGS  Calvarium and upper cervical spine: No marrow signal abnormality.  Orbits: Bilateral cataract resection.  Sinuses: Clear. Mastoid and middle ears are clear.  Brain: Cortically based patchy restricted diffusion throughout the upper right cerebral convexity, upper division MCA territory predominantly. Infarcts extend from the subcortical anterior frontal lobe into the parietal and  posterior temporal lobe. The largest discrete infarct is 21 mm. No acute infarct outside of this arterial distribution. Gradient hypo intensity patchy along the acute infarcts, consistent with petechial hemorrhage. There is no measurable hematoma.  Cerebral volume is excellent for age. There is a remote small vessel infarct in the left pons  Extra-axial T2 hypo intense mass along the mid sylvian fissure on the right measuring 11 mm. Appearance is most consistent with a meningioma, but noted history of breast cancer.  No hydrocephalus.  MRA HEAD FINDINGS  Balanced vertebral arteries. The basilar is normal. The AICA is dominant on the right. The AICA and PICA are symmetric on the left and appear normal. Superior cerebellar arteries are symmetric and normal. Moderate stenosis of right P3 branches proximally. No evidence of aneurysm or major branch occlusion.  Balanced carotid arteries. An anterior communicating artery is present. Posterior communicating arteries are not visible.  There is mild luminal irregularity of the cavernous carotids consistent with atherosclerosis. No major vessel stenosis. No definitive branch occlusion. No aneurysm or evidence of vascular malformation.  IMPRESSION: 1. Patchy acute infarct in the upper right MCA territory with scattered petechial hemorrhage. The volume of infarct is grossly stable from prior head CT. No treatable intracranial stenosis or branch occlusion. 2. Mild for age intracranial atherosclerosis, best visualized in the cavernous carotids and distal right PCA branches. 3. 11 mm extra-axial mass along the right cerebral convexity, appearance consistent with meningioma. In this patient with history breast cancer, 1 followup at 6 months is recommended to document stability.   Electronically Signed   By: Monte Fantasia M.D.   On: 03/07/2015 08:22     Non-Invasive Vascular Imaging:  Carotid duplex Preliminary report: Right: Greater than 80% internal carotid  artery  stenosis. Left: 1-39% ICA stenosis. Bilateral: Vertebral artery flow is antegrade.   ASSESSMENT/PLAN:  Right carotid ICA stenosis >80%  Right CVA with left upper extremity weakness resolving I have ordered CTA head and neck down to the arch for pre-op preporation.   She has been started on 81 mg aspirin and the plan is to start Plavix.     Laurence Slate [facility] 03/08/2015 10:43 AM  Addendum  I have independently interviewed and examined the patient, and I agree with the physician assistant's findings.  MRI findings consistent with R side CVA.  R carotid duplex preliminarily read at >80%.  I will review the study myself and finalize it.  I am ordering CTA neck to better image the disease (%, calcification) and extent of proximal extension.  Based on NASCET, benefit from CEA only if lifespan likely to be at least an additional 5 years.  Subsequently, the risk-benefit analysis likely depends on the plaque location and characteristics.  If a CEA is likely to be straight forward, then likely a benefit for R CEA.  In regards to carotid stenting, the CAS outcome for >80 years in CREST was markedly worse than CEA, so unless her cardiac risk stratification is consistent with likely cardiac event, I do not routine offer CAS for >80 year patients.  Continue maximal medical management including statin and antiplatelets,   In regards to timing of an intervention, I routinely wait 2-4 weeks after an acute CVA to allow the brain to recover and avoid extension of brain ischemia with transient hypotension during induction on general anesthesia.   Will check on patient after CTA neck is available.  Adele Barthel, MD [facility] Office: [phone] Pager: [phone]  03/08/2015, 3:19 PM

## 2015-03-08 NOTE — Evaluation (Signed)
Speech Language Pathology Evaluation Patient Details Name: [name] MRN: [id number] DOB: Nov 09, 1931 Today's Date: 03/08/2015 Time: 5400-8676 SLP Time Calculation (min) (ACUTE ONLY): 24 min  Problem List:  Patient Active Problem List   Diagnosis Date Noted  . Carotid stenosis   . Stroke 03/06/2015  . HLD (hyperlipidemia) 03/06/2015  . GERD without esophagitis 03/06/2015  . Stroke with cerebral ischemia 03/06/2015  . Hyperparathyroidism, primary 05/21/2011  . Lump 04/30/2011  . High blood pressure 04/30/2011  . Breast cancer 04/30/2011  . Wears glasses 04/30/2011  . Cataracts 04/30/2011  . Gum disease 04/30/2011   Past Medical History:  Past Medical History  Diagnosis Date  . Hypertension   . Cataract   . Parathyroid disease     Clarify with patient. No detail indicated on history form.  . Cancer     breast   Past Surgical History:  Past Surgical History  Procedure Laterality Date  . Parathyroidectomy  June 2010  . Breast lumpectomy  2008    Confirm type with patient. Not indicated on medical history form dated 12/26/10.   HPI:  81 y.o. female admitted with sudden L arm weakness and numbness. CT (+) R postcentral gyrus and R frontal gyrus CVA PMH: hypertension, hyperlipidemia, CKD stage 2-3, primary parathyroidism (status post parathyroid resection in 2010), history of breast cancer and mild tobacco abuse   Assessment / Plan / Recommendation Clinical Impression  Pt requires Min cues for selective attention in mildly distracting environment, as well as Mod-Max cues for retrieval of new information after 5 minute delay. Pt reports that these areas of impairment are new and that she was independent PTA, however requires Mod cues for anticipatory awareness and mental flexibility too discuss how this may impact her upon return home. Recommend intermittent supervision upon return home as well as HH/OP SLP f/u for cognitive impairments to maximize functional independence.     SLP Assessment  Patient needs continued Speech Lanaguage Pathology Services    Follow Up Recommendations  Home health SLP;Outpatient SLP    Frequency and Duration min 2x/week  1 week   Pertinent Vitals/Pain Pain Assessment: No/denies pain   SLP Goals  Patient/Family Stated Goal: none stated by patient Potential to Achieve Goals (ACUTE ONLY): Good  SLP Evaluation Prior Functioning  Cognitive/Linguistic Baseline: Within functional limits Type of Home: House  Lives With: Son;Other (Comment);Family (2 sons and a grandson) Available Help at Discharge: Family;Available 24 hours/day   Cognition  Overall Cognitive Status: Impaired/Different from baseline Arousal/Alertness: Awake/alert Orientation Level: Oriented X4 Attention: Selective Selective Attention: Impaired Selective Attention Impairment: Functional basic;Verbal basic Memory: Impaired Memory Impairment: Retrieval deficit;Decreased recall of new information Awareness: Impaired Awareness Impairment: Anticipatory impairment Problem Solving: Impaired Problem Solving Impairment: Verbal complex Safety/Judgment: Impaired    Comprehension  Auditory Comprehension Overall Auditory Comprehension: Appears within functional limits for tasks assessed Visual Recognition/Discrimination Discrimination: Within Function Limits Reading Comprehension Reading Status: Not tested    Expression Expression Primary Mode of Expression: Verbal Verbal Expression Overall Verbal Expression: Appears within functional limits for tasks assessed Written Expression Written Expression: Not tested   Oral / Motor Motor Speech Overall Motor Speech: Appears within functional limits for tasks assessed    Germain Osgood, M.A. CCC-SLP [phone]  Germain Osgood 03/08/2015, 12:18 PM

## 2015-03-09 ENCOUNTER — Telehealth: Payer: Self-pay | Admitting: Vascular Surgery

## 2015-03-09 LAB — PARATHYROID HORMONE, INTACT (NO CA): PTH: 29 pg/mL (ref 15–65)

## 2015-03-09 LAB — PTH, INTACT AND CALCIUM: Calcium, Total (PTH): 9.3 mg/dL (ref 8.7–10.3)

## 2015-03-09 MED ORDER — STUDY - INVESTIGATIONAL DRUG SIMPLE RECORD: 75.0000 mg | Freq: Every day | Status: DC

## 2015-03-09 MED ORDER — ASPIRIN 81 MG PO TBEC: 81.0000 mg | DELAYED_RELEASE_TABLET | Freq: Every day | ORAL | Status: DC

## 2015-03-09 MED ORDER — RANITIDINE HCL 150 MG/10ML PO SYRP: 150.0000 mg | ORAL_SOLUTION | Freq: Two times a day (BID) | ORAL | Status: DC

## 2015-03-09 NOTE — Progress Notes (Signed)
Talked to patient with daughter present about [place] choices, patient does not want any HHC at this time; CM informed her that if she changed her mind to notify her PCP Dr Osborne Casco and [facility] can be arranged from the office; B Pennie Rushing [phone]

## 2015-03-09 NOTE — Progress Notes (Signed)
STROKE TEAM PROGRESS NOTE   HISTORY [name] is an 81 y.o. female who was at [facility] when she had a sudden onset of left arm weakness and decreased sensation (LKW 03/06/2015 at 1315). On arrival to ED she started to have symptoms resolve. While in CT her symptoms fully resolved. At present time she feels back to her baseline. Ct head showed no acute stroke, bleed or mass. Current exam shows only dysmetria of left arm. Modified Rankin: Rankin Score=0. Patient was not administered TPA secondary to symptoms resolved. She was admitted for further evaluation and treatment.   SUBJECTIVE (INTERVAL HISTORY) Family at bedside. Dr Bridgett Larsson has seen the patient. Possible right internal carotid stent vs. CEA in 2 weeks  OBJECTIVE Temp:  [97.8 F (36.6 C)-98.1 F (36.7 C)] 98 F (36.7 C) (04/29 1320) Pulse Rate:  [71-93] 76 (04/29 1320) Cardiac Rhythm:  [-] Normal sinus rhythm (04/28 2133) Resp:  [18-20] 18 (04/29 1320) BP: (131-177)/(51-80) 139/69 mmHg (04/29 1320) SpO2:  [100 %] 100 % (04/29 1320)   Recent Labs Lab 03/06/15 1544  GLUCAP 100*    Recent Labs Lab 03/06/15 1545 03/06/15 1556 03/06/15 1920 03/07/15 0407 03/08/15 0450  NA 142 143  --  140 143  K 3.8 3.7  --  3.4* 3.7  CL 107 106  --  107 109  CO2 24  --   --  25 24  GLUCOSE 108* 106*  --  143* 117*  BUN 18 19  --  19 14  CREATININE 1.24* 1.20*  --  1.08 1.11*  CALCIUM 9.4  --  9.3 8.6 8.4  PHOS  --   --  3.3  --   --     Recent Labs Lab 03/06/15 1545  AST 21  ALT 16  ALKPHOS 89  BILITOT 0.5  PROT 7.3  ALBUMIN 3.7    Recent Labs Lab 03/06/15 1545 03/06/15 1556 03/07/15 0407 03/08/15 0450  WBC 10.4  --  7.4 8.1  NEUTROABS 7.3  --   --   --   HGB 12.1 13.3 10.8* 10.6*  HCT 37.1 39.0 33.2* 32.9*  MCV 87.1  --  87.8 89.4  PLT 241  --  218 218   No results for input(s): CKTOTAL, CKMB, CKMBINDEX, TROPONINI in the last 168 hours. No results for input(s): LABPROT, INR in the last 72 hours.  Recent  Labs  03/06/15 1643  COLORURINE YELLOW  LABSPEC 1.010  PHURINE 5.5  GLUCOSEU NEGATIVE  HGBUR NEGATIVE  BILIRUBINUR NEGATIVE  KETONESUR NEGATIVE  PROTEINUR 30*  UROBILINOGEN 0.2  NITRITE NEGATIVE  LEUKOCYTESUR MODERATE*       Component Value Date/Time   CHOL 155 03/07/2015 0357   TRIG 220* 03/07/2015 0357   HDL 55 03/07/2015 0357   CHOLHDL 2.8 03/07/2015 0357   VLDL 44* 03/07/2015 0357   LDLCALC 56 03/07/2015 0357   Lab Results  Component Value Date   HGBA1C 6.0* 03/07/2015      Component Value Date/Time   LABOPIA NONE DETECTED 03/06/2015 1643   COCAINSCRNUR NONE DETECTED 03/06/2015 1643   LABBENZ NONE DETECTED 03/06/2015 1643   AMPHETMU NONE DETECTED 03/06/2015 1643   THCU NONE DETECTED 03/06/2015 1643   LABBARB NONE DETECTED 03/06/2015 1643     Recent Labs Lab 03/06/15 1545  ETH <5   I have personally reviewed the radiological images below and agree with the radiology interpretations.  Ct Head Wo Contrast 03/06/2015    1. Acute nonhemorrhagic infarcts involving the right postcentral  gyrus and right pre frontal gyrus. 2. Atherosclerosis. 3. Aspects score is 8/10   Mr Brain Wo Contrast 03/07/2015     - Patchy acute infarct in the upper right MCA territory with scattered petechial hemorrhage. The volume of infarct is grossly stable from prior head CT.  - 11 mm extra-axial mass along the right cerebral convexity, appearance consistent with meningioma. In this patient with history breast cancer, 1 followup at 6 months is recommended to document stability.     [name] Head/brain Wo Cm 03/07/2015     No treatable intracranial stenosis or branch occlusion.  Mild for age intracranial atherosclerosis, best visualized in the cavernous carotids and distal right PCA branches.   Carotid Doppler  Right: Greater than 80% internal carotid artery stenosis. Left: 1-39% ICA stenosis. Bilateral: Vertebral artery flow is antegrade.   2D Echocardiogram   - Left ventricle: The  cavity size was normal. Wall thickness wasincreased in a pattern of mild LVH. The estimated ejectionfraction was 65%. Wall motion was normal; there were no regionalwall motion abnormalities. - Right ventricle: The cavity size was normal. Systolic functionwas normal. Impressions: No cardiac source of embolism was identified, butcannot be ruled out on the basis of this examination.  CTA head  03/08/2015 1. Evolving acute patchy right MCA territory ischemic infarcts, overall stable in distribution as compared to prior MRI. No evidence for hemorrhagic transformation. Patchy post-contrast enhancement about the areas of infarction likely related to blood brain barrier breakdown and small petechial hemorrhage as seen on previous MRI. 2. No focal hemodynamically significant stenosis identified within the intracranial circulation. 3. Subtle attenuation within the distal right MCA artery branches as compared to the left, likely related to the acute right MCA territory infarcts. 4. Multi focal calcified atheromatous plaque within the cavernous carotid arteries bilaterally without focal high-grade stenosis. 5. 14 mm homogeneously enhancing mass overlying the right cerebral convexity. Finding most likely reflects a meningioma. However, given the history of breast cancer, a follow-up examination in 6 months is suggested to ensure stability.  CTA of the neck 03/08/2015 1. Calcified and noncalcified atheromatous plaque within the proximal right ICA with associated severe high-grade stenosis of approximately 90% by NASCET criteria. This stenosis begins at the right carotid bifurcation, and measures approximately 14 mm in length. Right ICA is well opacified distally to the skullbase. 2. Calcified atheromatous disease within the proximal left ICA with associated short segment stenosis of approximately 40% by NASCET criteria. This stenosis begins at the left carotid bifurcation, and measures  approximately 13 mm in length. Left ICA is well opacified distally to the skullbase. 3. Calcified plaque at the origin of the vertebral arteries without high-grade stenosis. Vertebral arteries are otherwise widely patent to the skullbase.  PHYSICAL EXAM  Temp:  [97.8 F (36.6 C)-98.1 F (36.7 C)] 98 F (36.7 C) (04/29 1320) Pulse Rate:  [71-93] 76 (04/29 1320) Resp:  [18-20] 18 (04/29 1320) BP: (131-177)/(51-80) 139/69 mmHg (04/29 1320) SpO2:  [100 %] 100 % (04/29 1320)  General - Well nourished, well developed, in no apparent distress.  Ophthalmologic - fundi not visualized due to incorporation.  Cardiovascular - Regular rate and rhythm with no murmur.  Mental Status -  Level of arousal and orientation to time, place, and person were intact. Language including expression, naming, repetition, comprehension was assessed and found intact. Fund of Knowledge was assessed and was intact.  Cranial Nerves II - XII - II - Visual field intact OU. III, IV, VI - Extraocular movements intact. V -  Facial sensation intact bilaterally. VII - Facial movement intact bilaterally. VIII - Hearing & vestibular intact bilaterally. X - Palate elevates symmetrically. XI - Chin turning & shoulder shrug intact bilaterally. XII - Tongue protrusion intact.  Motor Strength - The patient's strength was normal in all extremities except left UE 4/5 proximal and 5-/5 distal and pronator drift was present on the left. Bulk was normal and fasciculations were absent.   Motor Tone - Muscle tone was assessed at the neck and appendages and was normal.  Reflexes - The patient's reflexes were 1+ in all extremities and she had no pathological reflexes.  Sensory - Light touch, temperature/pinprick were assessed and were symmetrical.    Coordination - The patient had normal movements in the hands and feet with no ataxia or dysmetria.  Tremor was absent.  Gait and Station - deferred due to safety  concerns   ASSESSMENT/PLAN [name] is a 81 y.o. female with history of hypertension, hyperlipidemia, CKD stage 2-3, primary parathyroidism (status post parathyroid resection in 2010), history of breast cancer and tobacco abuse presenting with Left arm weakness and decreased sensation. She did not receive IV t-PA due to resolved symptoms.   Stroke:  Non-dominant right MCA scattered embolic infarct, secondary to R ICA stenosis > 80%. Enrolled in the POINT Trial  Resultant  Left hemiparesis arm > leg  MRI  R MCA scattered infarct  MRA  No large vessel stenosis, but decreased flow on the right MCA indicating proximal stenosis.  Carotid Doppler  R > 80% ICA stenosis.   CTA head and neck - right ICA 90% stenosis  2D Echo  No source of embolus   LDL 56  HgbA1c 6.0  SCDs for VTE prophylaxis Diet Heart Room service appropriate?: Yes; Fluid consistency:: Thin Diet - low sodium heart healthy  plendil prior to admission, now on aspirin 81 mg orally every day along with investigational drugs. Patient is enrollled in [place] is a randomized, double-blind, multicenter clinical trial to determine whether clopidogrel 75mg /day (after a loading dose of 600mg ) is effective in improving survival free from major ischemic vascular events (ischemic stroke, myocardial infarction, and ischemic vascular death) at 90 days when initiated within 12 hours time last known free of new ischemic symptoms of TIA or minor ischemic stroke in subjects receiving aspirin 50-325mg /day. Please contact [facility] at [phone] for any questions.   Ongoing aggressive stroke risk factor management  Therapy recommendations:  No PT or OT  Disposition:  Return home  Right ICA stenosis  Likely to explain pt embolic stroke right MCA  VVS consult done - stent vs CEA   CTA head and neck - right ICA 90% stenosis  POINT Investigational drug can be discontinued 5 days before the  CEA procedure and resumed after the procedure.   If CAS planned, then pt may need to be withdrawn from the study due to need of plavix use.  Avoid hypotension  Recommend pt to check BP at home and BP goal 130-160  Hypertension  Home med: losartan and metoprolol  Remains Stable Permissive hypertension (OK if <220/120) for 24-48 hours post stroke and then gradually normalized within 5-7 days.  Hyperlipidemia  Home meds:  crestor 20, resumed in hospital  LDL 56, goal < 70  Continue statin at discharge  Other Stroke Risk Factors  Advanced age  Current Cigarette smoker,  advised to stop smoking  ETOH use  Family hx stroke (father and mother)  PLAN  Recommend systolic blood pressures of 130 to 160  Follow-up with Dr. Leonie Man in 2 months  CTA Head -  14 mm homogeneously enhancing mass overlying the right cerebral convexity. Finding mos likely reflects a meningioma. However, given the history of breast cancer, a follow-up examination in 6  months is suggested to ensure stability. (Text message sent to Dr Tana Coast )  [facility] day # 3  Lambert Keto, Renwick Crafton for Pager information 03/09/2015 3:59 PM   I, the attending vascular neurologist, have personally obtained a history, examined the patient, evaluated laboratory data, individually viewed imaging studies and agree with radiology interpretations. I also discussed with Dr. Tana Coast regarding her care plan. Together with the NP/PA, we formulated the assessment and plan of care which reflects our mutual decision.  I have made any additions or clarifications directly to the above note and agree with the findings and plan as currently documented.   81 yo F with PMH of HTN, HLD was admitted for right MCA territory large stroke. CUS showed right ICA 80% stenosis. Recommend vascular surgery consult. CTA head and neck confirmed right ICA > 90% stenosis. Meanwhile, pt was enrolled into POINT trial and taking  investigational drug along with baby ASA. POINT Investigational drug can be discontinued 5 days before the procedure and resumed after the procedure. If CAS planned then pt should come off from the trial.   Neurology will sign off. Please call with questions. Pt will follow up with Dr. Caesar Chestnut at [facility] in about 2 months. Thanks for the consult.  Rosalin Hawking, MD PhD Stroke Neurology 03/09/2015 11:26 PM   To contact Stroke Continuity provider, please refer to [email or web address] After hours, contact General Neurology

## 2015-03-09 NOTE — Telephone Encounter (Signed)
Spoke with pt, dpm °

## 2015-03-09 NOTE — Progress Notes (Addendum)
   Daily Progress Note  Assessment/Planning: R CVA, sx RICA stenosis >80%   Follow up in office in 2 weeks for re-evaluation  Will need carotid angiogram as the extent of the plaque appears to be extending to angle of mandible on CTA.  Angiogram will be needed to determine suitability for CAS vs CEA.  High lesions have a higher chance of permanent CN injury.  If she has a high lesion, most are being managed with CAS if technically possible.  The angiogram will help determine that.  Maximal medical mgmt for now  Nothing more to add for now.   Subjective    No complaints  Objective Filed Vitals:   03/08/15 1711 03/08/15 2153 03/09/15 0133 03/09/15 0607  BP: 140/61 131/51 166/60 151/59  Pulse: 76 77 74 71  Temp: 98.1 F (36.7 C) 97.8 F (36.6 C) 97.9 F (36.6 C) 97.9 F (36.6 C)  TempSrc: Oral Oral Oral Oral  Resp: 20 20 18 18   Height:      Weight:      SpO2: 100% 100% 100% 100%   No intake or output data in the 24 hours ending 03/09/15 0822  NEURO unchanged exam, RUE M/S 3-4/5  Laboratory CBC    Component Value Date/Time   WBC 8.1 03/08/2015 0450   WBC 6.3 04/10/2011 1042   HGB 10.6* 03/08/2015 0450   HGB 10.9* 04/10/2011 1042   HCT 32.9* 03/08/2015 0450   HCT 32.5* 04/10/2011 1042   PLT 218 03/08/2015 0450   PLT 252 04/10/2011 1042    BMET    Component Value Date/Time   NA 143 03/08/2015 0450   K 3.7 03/08/2015 0450   CL 109 03/08/2015 0450   CO2 24 03/08/2015 0450   GLUCOSE 117* 03/08/2015 0450   BUN 14 03/08/2015 0450   CREATININE 1.11* 03/08/2015 0450   CALCIUM 8.4 03/08/2015 0450   CALCIUM 9.6 04/10/2011 1042   GFRNONAA 45* 03/08/2015 0450   GFRAA 52* 03/08/2015 0450    Adele Barthel, MD [facility] Office: [phone] Pager: [phone]  03/09/2015, 8:22 AM

## 2015-03-09 NOTE — Progress Notes (Signed)
Patient is discharged from room 4N28 at this time. Alert and in stable condition. IV site and tele d/c'd. Instructions read to patient and daughter with understanding verbalized. Left unit via wheelchair with all belongings.

## 2015-03-09 NOTE — Discharge Summary (Addendum)
Physician Discharge Summary   Patient ID: [name] MRN: [id number] DOB/AGE: Feb 22, 1931 81 y.o.  Admit date: 03/06/2015 Discharge date: 03/09/2015  Primary Care Physician:  Haywood Pao, MD  Discharge Diagnoses:    . acute right MCA territory infarct with left arm weakness/numbness  . internal carotid artery stenosis and right greater than 80%  . Hyperparathyroidism, primary . HLD (hyperlipidemia) . Breast cancer . GERD without esophagitis . essential hypertension  Consults:  Neurology Vascular surgery, Dr. Bridgett Larsson   Recommendations for Outpatient Follow-up:   Please note vascular surgery follow-up recommendations Follow up in 2 weeks, will need carotid angiogram to determine suitability for a CAS versus CEA.   Patient was enrolled into POINT trial and taking investigational drug along with baby ASA  CT angiogram of the head showed a small 11 mm meningioma, recommended re-imaging in 6 months with history of breast cancer   DIET: Heart healthy diet    Allergies:  No Known Allergies   Discharge Medications:   Medication List    STOP taking these medications        doxycycline 100 MG capsule  Commonly known as:  VIBRAMYCIN      TAKE these medications        aspirin 81 MG EC tablet  Take 1 tablet (81 mg total) by mouth daily.     cholecalciferol 400 UNITS Tabs tablet  Commonly known as:  VITAMIN D  Take 400 Units by mouth daily.     felodipine 10 MG 24 hr tablet  Commonly known as:  PLENDIL  Take 10 mg by mouth daily.     ferrous sulfate 325 (65 FE) MG tablet  Take 325 mg by mouth daily with breakfast.     ibandronate 150 MG tablet  Commonly known as:  BONIVA  Take 150 mg by mouth every 30 (thirty) days. Pt takes on 6th --Take in the morning with a full glass of water, on an empty stomach, and do not take anything else by mouth or lie down for the next 30 min.     losartan 25 MG tablet  Commonly known as:  COZAAR  Take 25 mg by mouth daily.      metoprolol 50 MG tablet  Commonly known as:  LOPRESSOR  Take 50 mg by mouth daily. Verify dosage/frequency with patient. Not indicated on history form.     potassium chloride SA 20 MEQ tablet  Commonly known as:  K-DUR,KLOR-CON  Take 20 mEq by mouth 2 (two) times daily.     ranitidine 150 MG/10ML syrup  Commonly known as:  ZANTAC  Take 10 mLs (150 mg total) by mouth 2 (two) times daily.     research study medication  Take 0.015 each (75 mg total) by mouth daily with breakfast.     rosuvastatin 20 MG tablet  Commonly known as:  CRESTOR  Take 20 mg by mouth daily. Cholesterol med- not sure of name or dose.         Brief H and P: For complete details please refer to admission H and P, but in briefPatient is a pleasant 81 year old female with a past medical history of hypertension, dyslipidemia, stage II chronic kidney disease admitted to medicine service on 03/06/2015 presented with complaints of left arm weakness and numbness.   Hospital Course:  Acute right MCA territory infarct with left arm weakness/numbness CT scan of brain without contrast performed on admission revealed an acute nonhemorrhagic infarct involving right postcentral gyrus and right prefrontal gyrus. Further  workup included an MRI of the brain that showed patchy acute infarct in the upper right MCA territory with scattered petechial hemorrhage. Patient was evaluated by the stroke team. She is enrolled in the [facility]. Carotid Dopplers revealed greater than 80% internal carotid artery stenosis. Vascular surgery was consulted and patient was seen by Dr. Bridgett Larsson. Vessel surgery recommended follow-up in office in 2 weeks for reevaluation and patient will need carotid angiogram as the extent of the plaque appears to be extending to the angle of mandible on CTA. Angiogram will determine suitability for carotid artery stenting versus endarterectomy. Currently recommended maximal medical management.  2-D echo showed EF of 65%,  mild LVH, no guarding source of embolism. Physical therapy recommended intermittent supervision, home PT, OT, SLP were arranged  Internal carotid artery stenosis. -Carotid Dopplers revealing a right greater than 80% internal carotid artery stenosis, please refer to #1 for vascular surgery recommendations   Hypertension. -Continue metoprolol 50 mg by mouth daily along with Cozaar 25 mg by mouth daily  4. Dyslipidemia. -Continue statin therapy. Lipid panel showed triglycerides 220, LDL 56.   Day of Discharge BP 177/80 mmHg  Pulse 93  Temp(Src) 97.8 F (36.6 C) (Oral)  Resp 20  Ht 5\' 2"  (1.575 m)  Wt 55.747 kg (122 lb 14.4 oz)  BMI 22.47 kg/m2  SpO2 100%  Physical Exam: General: Alert and awake oriented x3 not in any acute distress. HEENT: anicteric sclera, pupils reactive to light and accommodation CVS: S1-S2 clear no murmur rubs or gallops Chest: clear to auscultation bilaterally, no wheezing rales or rhonchi Abdomen: soft nontender, nondistended, normal bowel sounds Extremities: no cyanosis, clubbing or edema noted bilaterally Neuro: Cranial nerves II-XII intact, left upper extremity 4/5, bilateral lower extremities 5/5 overall improving   The results of significant diagnostics from this hospitalization (including imaging, microbiology, ancillary and laboratory) are listed below for reference.    LAB RESULTS: Basic Metabolic Panel:  Recent Labs Lab 03/06/15 1920 03/07/15 0407 03/08/15 0450  NA  --  140 143  K  --  3.4* 3.7  CL  --  107 109  CO2  --  25 24  GLUCOSE  --  143* 117*  BUN  --  19 14  CREATININE  --  1.08 1.11*  CALCIUM  --  8.6 8.4  PHOS 3.3  --   --    Liver Function Tests:  Recent Labs Lab 03/06/15 1545  AST 21  ALT 16  ALKPHOS 89  BILITOT 0.5  PROT 7.3  ALBUMIN 3.7   No results for input(s): LIPASE, AMYLASE in the last 168 hours. No results for input(s): AMMONIA in the last 168 hours. CBC:  Recent Labs Lab 03/06/15 1545   03/07/15 0407 03/08/15 0450  WBC 10.4  --  7.4 8.1  NEUTROABS 7.3  --   --   --   HGB 12.1  < > 10.8* 10.6*  HCT 37.1  < > 33.2* 32.9*  MCV 87.1  --  87.8 89.4  PLT 241  --  218 218  < > = values in this interval not displayed. Cardiac Enzymes: No results for input(s): CKTOTAL, CKMB, CKMBINDEX, TROPONINI in the last 168 hours. BNP: Invalid input(s): POCBNP CBG:  Recent Labs Lab 03/06/15 1544  GLUCAP 100*    Significant Diagnostic Studies:  Ct Head Wo Contrast  03/06/2015   CLINICAL DATA:  Left-sided weakness.  EXAM: CT HEAD WITHOUT CONTRAST  TECHNIQUE: Contiguous axial images were obtained from the base of the skull  through the vertex without intravenous contrast.  COMPARISON:  None.  FINDINGS: Foci of hypoattenuation are noted within the post central gyrus and pre frontal gyrus on the right the basal ganglia are intact. The insular ribbon is intact. Cerebellum is unremarkable.  No acute hemorrhage or mass lesion is present.  The paranasal sinuses and mastoid air cells are clear. The calvarium is intact. Vascular calcifications are present within the cavernous internal carotid arteries and at the dural margin of the vertebral arteries. The patient is status post bilateral lens replacements. The globes and orbits are otherwise intact.  ASPECTS score = 8/10  [place] Stroke Program Early CT Score  Normal score = 10  IMPRESSION: 1. Acute nonhemorrhagic infarcts involving the right postcentral gyrus and right pre frontal gyrus. 2. Atherosclerosis. 3. Aspects score is 8/10 These results were called by telephone at the time of interpretation on 03/06/2015 at 3:53 Pm to Dr. Wallie Char , who verbally acknowledged these results.   Electronically Signed   By: San Morelle M.D.   On: 03/06/2015 16:28   Mr Brain Wo Contrast  03/07/2015   CLINICAL DATA:  Sudden onset left arm weakness and decreased sensation  EXAM: MRI HEAD WITHOUT CONTRAST  MRA HEAD WITHOUT CONTRAST  TECHNIQUE: Multiplanar,  multiecho pulse sequences of the brain and surrounding structures were obtained without intravenous contrast. Angiographic images of the head were obtained using MRA technique without contrast.  COMPARISON:  Head CT from 1 day prior  FINDINGS: MRI HEAD FINDINGS  Calvarium and upper cervical spine: No marrow signal abnormality.  Orbits: Bilateral cataract resection.  Sinuses: Clear. Mastoid and middle ears are clear.  Brain: Cortically based patchy restricted diffusion throughout the upper right cerebral convexity, upper division MCA territory predominantly. Infarcts extend from the subcortical anterior frontal lobe into the parietal and posterior temporal lobe. The largest discrete infarct is 21 mm. No acute infarct outside of this arterial distribution. Gradient hypo intensity patchy along the acute infarcts, consistent with petechial hemorrhage. There is no measurable hematoma.  Cerebral volume is excellent for age. There is a remote small vessel infarct in the left pons  Extra-axial T2 hypo intense mass along the mid sylvian fissure on the right measuring 11 mm. Appearance is most consistent with a meningioma, but noted history of breast cancer.  No hydrocephalus.  MRA HEAD FINDINGS  Balanced vertebral arteries. The basilar is normal. The AICA is dominant on the right. The AICA and PICA are symmetric on the left and appear normal. Superior cerebellar arteries are symmetric and normal. Moderate stenosis of right P3 branches proximally. No evidence of aneurysm or major branch occlusion.  Balanced carotid arteries. An anterior communicating artery is present. Posterior communicating arteries are not visible.  There is mild luminal irregularity of the cavernous carotids consistent with atherosclerosis. No major vessel stenosis. No definitive branch occlusion. No aneurysm or evidence of vascular malformation.  IMPRESSION: 1. Patchy acute infarct in the upper right MCA territory with scattered petechial hemorrhage. The  volume of infarct is grossly stable from prior head CT. No treatable intracranial stenosis or branch occlusion. 2. Mild for age intracranial atherosclerosis, best visualized in the cavernous carotids and distal right PCA branches. 3. 11 mm extra-axial mass along the right cerebral convexity, appearance consistent with meningioma. In this patient with history breast cancer, 1 followup at 6 months is recommended to document stability.   Electronically Signed   By: Monte Fantasia M.D.   On: 03/07/2015 08:22   [name] Head/brain Wo Cm  03/07/2015   CLINICAL DATA:  Sudden onset left arm weakness and decreased sensation  EXAM: MRI HEAD WITHOUT CONTRAST  MRA HEAD WITHOUT CONTRAST  TECHNIQUE: Multiplanar, multiecho pulse sequences of the brain and surrounding structures were obtained without intravenous contrast. Angiographic images of the head were obtained using MRA technique without contrast.  COMPARISON:  Head CT from 1 day prior  FINDINGS: MRI HEAD FINDINGS  Calvarium and upper cervical spine: No marrow signal abnormality.  Orbits: Bilateral cataract resection.  Sinuses: Clear. Mastoid and middle ears are clear.  Brain: Cortically based patchy restricted diffusion throughout the upper right cerebral convexity, upper division MCA territory predominantly. Infarcts extend from the subcortical anterior frontal lobe into the parietal and posterior temporal lobe. The largest discrete infarct is 21 mm. No acute infarct outside of this arterial distribution. Gradient hypo intensity patchy along the acute infarcts, consistent with petechial hemorrhage. There is no measurable hematoma.  Cerebral volume is excellent for age. There is a remote small vessel infarct in the left pons  Extra-axial T2 hypo intense mass along the mid sylvian fissure on the right measuring 11 mm. Appearance is most consistent with a meningioma, but noted history of breast cancer.  No hydrocephalus.  MRA HEAD FINDINGS  Balanced vertebral arteries. The  basilar is normal. The AICA is dominant on the right. The AICA and PICA are symmetric on the left and appear normal. Superior cerebellar arteries are symmetric and normal. Moderate stenosis of right P3 branches proximally. No evidence of aneurysm or major branch occlusion.  Balanced carotid arteries. An anterior communicating artery is present. Posterior communicating arteries are not visible.  There is mild luminal irregularity of the cavernous carotids consistent with atherosclerosis. No major vessel stenosis. No definitive branch occlusion. No aneurysm or evidence of vascular malformation.  IMPRESSION: 1. Patchy acute infarct in the upper right MCA territory with scattered petechial hemorrhage. The volume of infarct is grossly stable from prior head CT. No treatable intracranial stenosis or branch occlusion. 2. Mild for age intracranial atherosclerosis, best visualized in the cavernous carotids and distal right PCA branches. 3. 11 mm extra-axial mass along the right cerebral convexity, appearance consistent with meningioma. In this patient with history breast cancer, 1 followup at 6 months is recommended to document stability.   Electronically Signed   By: Monte Fantasia M.D.   On: 03/07/2015 08:22    2D ECHO: Study Conclusions  - Left ventricle: The cavity size was normal. Wall thickness was increased in a pattern of mild LVH. The estimated ejection fraction was 65%. Wall motion was normal; there were no regional wall motion abnormalities. - Right ventricle: The cavity size was normal. Systolic function was normal. - Impressions: No cardiac source of embolism was identified, but cannot be ruled out on the basis of this examination.  Impressions:  - No cardiac source of embolism was identified, but cannot be ruled out on the basis of this examination.  Disposition and Follow-up:     Discharge Instructions    Diet - low sodium heart healthy    Complete by:  As directed       Increase activity slowly    Complete by:  As directed             DISPOSITION:  Home   DISCHARGE FOLLOW-UP Follow-up Information    Follow up with Hinda Lenis, MD. Schedule an appointment as soon as possible for a visit in 2 weeks.   Specialty:  Vascular Surgery   Why:  for hospital  follow-up   Contact information:   [place] [address] [phone]       Follow up with Xu,Jindong, MD. Schedule an appointment as soon as possible for a visit in 1 month.   Specialty:  Neurology   Why:  for hospital follow-up/stroke clinic   Contact information:   [address] [place] [address] [phone]       Follow up with Haywood Pao, MD. Schedule an appointment as soon as possible for a visit in 2 weeks.   Specialty:  Internal Medicine   Why:  for hospital follow-up   Contact information:   [place] [address] [phone]        Time spent on Discharge:  40 minutes  Signed:   RAI,RIPUDEEP M.D. [facility] 03/09/2015, 12:16 PM Pager: [phone]

## 2015-03-09 NOTE — Telephone Encounter (Signed)
-----   Message from Mena Goes, RN sent at 03/09/2015  9:08 AM EDT ----- Regarding: Schedule   ----- Message -----    From: Conrad [place], MD    Sent: 03/09/2015   8:26 AM      To: [address]  BYANKA LANDRUS [id number] Aug 05, 1931  R1 charge  Follow-up: 2 weeks

## 2015-03-12 ENCOUNTER — Encounter (HOSPITAL_COMMUNITY): Payer: Self-pay | Admitting: Neurology

## 2015-03-12 ENCOUNTER — Emergency Department (HOSPITAL_COMMUNITY)
Admission: EM | Admit: 2015-03-12 | Discharge: 2015-03-12 | Disposition: A | Discharge status: Home / Self Care | Payer: Medicare Other

## 2015-03-12 NOTE — ED Notes (Signed)
Pt family, and pt came up and stated they did not want to stay.

## 2015-03-13 ENCOUNTER — Telehealth: Payer: Self-pay

## 2015-03-13 NOTE — Telephone Encounter (Signed)
Called subject for 7 day visit ( POINT trial). Spoke to her daughter also. They confirmed subject is taking the POINT medicine.

## 2015-03-22 ENCOUNTER — Encounter: Payer: Self-pay | Admitting: Vascular Surgery

## 2015-03-23 ENCOUNTER — Ambulatory Visit (INDEPENDENT_AMBULATORY_CARE_PROVIDER_SITE_OTHER): Payer: Medicare Other | Admitting: Vascular Surgery

## 2015-03-23 ENCOUNTER — Other Ambulatory Visit: Payer: Self-pay

## 2015-03-23 ENCOUNTER — Encounter: Payer: Self-pay | Admitting: Vascular Surgery

## 2015-03-23 DIAGNOSIS — I639 Cerebral infarction, unspecified: Secondary | ICD-10-CM

## 2015-03-23 DIAGNOSIS — R531 Weakness: Secondary | ICD-10-CM | POA: Insufficient documentation

## 2015-03-23 DIAGNOSIS — K921 Melena: Secondary | ICD-10-CM | POA: Insufficient documentation

## 2015-03-23 DIAGNOSIS — I6523 Occlusion and stenosis of bilateral carotid arteries: Secondary | ICD-10-CM | POA: Diagnosis not present

## 2015-03-23 NOTE — Progress Notes (Signed)
    Established Carotid Patient  History of Present Illness  [name] is a 81 y.o. ([birth year]) female who presents with chief complaint: minor weakness in L arm.  This patient was seen previously in the hospital on 03/08/15 after a R MCA distribution stroke with L sided weakness.  CTA Neck found a stenosis >90% that extended to level of mandible.  I recommended R carotid angiogram in 2 weeks if she recovered from her CVA.  The patient has been undergoing PT/OT.  She is able to walk without assistance and her left arm weakness is nearly resolved.  She has no further episodes of L sided weakness  The patient's PMH, PSH, SH, FamHx, Med, and Allergies are unchanged from 03/08/15.  On ROS today: mild L arm weakness, no ocular sx, normal gait  Physical Examination  Filed Vitals:   03/23/15 0948  BP: 148/74  Pulse: 72  Resp: 16  Height: 5\' 3"  (1.6 m)  Weight: 114 lb (51.71 kg)  SpO2: 99%   Body mass index is 20.2 kg/(m^2).  General: A&O x 3, WD, thin  Eyes: PERRLA, EOMI  Neck: Supple, mp nuchal rigidity, mp palpable LAD  Pulmonary: Sym exp, good air movt, CTAB, no rales, rhonchi, & wheezing  Cardiac: RRR, Nl S1, S2, no Murmurs, rubs or gallops  Vascular: Vessel Right Left  Radial Palpable Palpable  Brachial Palpable Palpable  Carotid Palpable, without bruit Palpable, without bruit  Aorta Not palpable N/A  Femoral Palpable Palpable  Popliteal Not palpable Not palpable  PT Palpable Palpable  DP FaintlyPalpable Faintly Palpable   Gastrointestinal: soft, NTND, -G/R, - HSM, - masses, - CVAT B  Musculoskeletal: M/S 5/5 throughout but R>L, Extremities without ischemic changes   Neurologic: CN 2-12 intact , Pain and light touch intact in extremities , Motor exam as listed above   Medical Decision Making  [name] is a 81 y.o. female who presents with: sx R ICA stenosis 90% with associated R CVA, L ICA stenosis ~40%   Based on the patient's vascular studies and  examination, I have offered the patient: R carotid angiogram.  I have concerns that the R ICA lesion may be too high for a CEA, so evaluation with carotid angiogram with necessary to determine R CEA vs R CAS  I discussed in depth with the patient the nature of atherosclerosis, and emphasized the importance of maximal medical management including strict control of blood pressure, blood glucose, and lipid levels, antiplatelet agents, obtaining regular exercise, and cessation of smoking.    The patient is aware that without maximal medical management the underlying atherosclerotic disease process will progress, limiting the benefit of any interventions. The patient is currently on a statin: Pravastatin The patient is currently on an anti-platelet: ASA.  Thank you for allowing [place] to participate in this patient's care.  Adele Barthel, MD [facility] Office: [phone] Pager: [phone]  03/23/2015, 11:43 AM

## 2015-03-28 ENCOUNTER — Other Ambulatory Visit: Payer: Self-pay

## 2015-03-28 DIAGNOSIS — Z1231 Encounter for screening mammogram for malignant neoplasm of breast: Secondary | ICD-10-CM

## 2015-04-02 ENCOUNTER — Encounter (HOSPITAL_COMMUNITY): Payer: Self-pay | Admitting: Vascular Surgery

## 2015-04-02 ENCOUNTER — Ambulatory Visit (HOSPITAL_COMMUNITY)
Admission: RE | Admit: 2015-04-02 | Discharge: 2015-04-02 | Disposition: A | Discharge status: Home / Self Care | Payer: Medicare Other | Source: Ambulatory Visit | Attending: Vascular Surgery | Admitting: Vascular Surgery

## 2015-04-02 ENCOUNTER — Encounter (HOSPITAL_COMMUNITY)
Admission: RE | Disposition: A | Discharge status: Home / Self Care | Payer: Medicare Other | Source: Ambulatory Visit | Attending: Vascular Surgery

## 2015-04-02 DIAGNOSIS — I6521 Occlusion and stenosis of right carotid artery: Secondary | ICD-10-CM | POA: Insufficient documentation

## 2015-04-02 DIAGNOSIS — I6523 Occlusion and stenosis of bilateral carotid arteries: Secondary | ICD-10-CM | POA: Diagnosis present

## 2015-04-02 HISTORY — PX: PERIPHERAL VASCULAR CATHETERIZATION: SHX172C

## 2015-04-02 LAB — POCT I-STAT, CHEM 8
BUN: 19 mg/dL (ref 6–20)
CREATININE: 1.2 mg/dL — AB (ref 0.44–1.00)
Calcium, Ion: 1.16 mmol/L (ref 1.13–1.30)
Chloride: 107 mmol/L (ref 101–111)
GLUCOSE: 102 mg/dL — AB (ref 65–99)
HCT: 41 % (ref 36.0–46.0)
Hemoglobin: 13.9 g/dL (ref 12.0–15.0)
POTASSIUM: 3.5 mmol/L (ref 3.5–5.1)
Sodium: 143 mmol/L (ref 135–145)
TCO2: 24 mmol/L (ref 0–100)

## 2015-04-02 SURGERY — CAROTID ANGIOGRAPHY
Anesthesia: LOCAL | Laterality: Right

## 2015-04-02 MED ORDER — SODIUM CHLORIDE 0.9 % IV SOLN: 1.0000 mL/kg/h | INTRAVENOUS | Status: DC

## 2015-04-02 MED ORDER — HEPARIN (PORCINE) IN NACL 2-0.9 UNIT/ML-% IJ SOLN
INTRAMUSCULAR | Status: AC
  Filled 2015-04-02: qty 1000

## 2015-04-02 MED ORDER — ACETAMINOPHEN 325 MG PO TABS: 650.0000 mg | ORAL_TABLET | ORAL | Status: DC | PRN

## 2015-04-02 MED ORDER — LIDOCAINE HCL (PF) 1 % IJ SOLN
INTRAMUSCULAR | Status: AC
  Filled 2015-04-02: qty 30

## 2015-04-02 MED ORDER — SODIUM CHLORIDE 0.9 % IV SOLN
INTRAVENOUS | Status: DC
  Administered 2015-04-02: 11:00:00 via INTRAVENOUS

## 2015-04-02 SURGICAL SUPPLY — 11 items
CATH ANGIO 5F BER2 100CM (CATHETERS) ×2 IMPLANT
CATH INFINITI 5 FR STR PIGTAIL (CATHETERS) ×2 IMPLANT
COVER PRB 48X5XTLSCP FOLD TPE (BAG) ×1 IMPLANT
COVER PROBE 5X48 (BAG) ×2
KIT PV (KITS) ×2 IMPLANT
SET INTRODUCER MICROPUNCT 5F (INTRODUCER) ×2 IMPLANT
SHEATH PINNACLE 5F 10CM (SHEATH) ×2 IMPLANT
SYR MEDRAD MARK V 150ML (SYRINGE) IMPLANT
TRANSDUCER W/STOPCOCK (MISCELLANEOUS) ×2 IMPLANT
TRAY PV CATH (CUSTOM PROCEDURE TRAY) ×2 IMPLANT
WIRE BENTSON .035X145CM (WIRE) ×2 IMPLANT

## 2015-04-02 NOTE — H&P (View-Only) (Signed)
    Established Carotid Patient  History of Present Illness  [name] is a 81 y.o. (1931-01-19) female who presents with chief complaint: minor weakness in L arm.  This patient was seen previously in the hospital on 03/08/15 after a R MCA distribution stroke with L sided weakness.  CTA Neck found a stenosis >90% that extended to level of mandible.  I recommended R carotid angiogram in 2 weeks if she recovered from her CVA.  The patient has been undergoing PT/OT.  She is able to walk without assistance and her left arm weakness is nearly resolved.  She has no further episodes of L sided weakness  The patient's PMH, PSH, SH, FamHx, Med, and Allergies are unchanged from 03/08/15.  On ROS today: mild L arm weakness, no ocular sx, normal gait  Physical Examination  Filed Vitals:   03/23/15 0948  BP: 148/74  Pulse: 72  Resp: 16  Height: 5\' 3"  (1.6 m)  Weight: 114 lb (51.71 kg)  SpO2: 99%   Body mass index is 20.2 kg/(m^2).  General: A&O x 3, WD, thin  Eyes: PERRLA, EOMI  Neck: Supple, mp nuchal rigidity, mp palpable LAD  Pulmonary: Sym exp, good air movt, CTAB, no rales, rhonchi, & wheezing  Cardiac: RRR, Nl S1, S2, no Murmurs, rubs or gallops  Vascular: Vessel Right Left  Radial Palpable Palpable  Brachial Palpable Palpable  Carotid Palpable, without bruit Palpable, without bruit  Aorta Not palpable N/A  Femoral Palpable Palpable  Popliteal Not palpable Not palpable  PT Palpable Palpable  DP FaintlyPalpable Faintly Palpable   Gastrointestinal: soft, NTND, -G/R, - HSM, - masses, - CVAT B  Musculoskeletal: M/S 5/5 throughout but R>L, Extremities without ischemic changes   Neurologic: CN 2-12 intact , Pain and light touch intact in extremities , Motor exam as listed above   Medical Decision Making  [name] is a 81 y.o. female who presents with: sx R ICA stenosis 90% with associated R CVA, L ICA stenosis ~40%   Based on the patient's vascular studies and  examination, I have offered the patient: R carotid angiogram.  I have concerns that the R ICA lesion may be too high for a CEA, so evaluation with carotid angiogram with necessary to determine R CEA vs R CAS  I discussed in depth with the patient the nature of atherosclerosis, and emphasized the importance of maximal medical management including strict control of blood pressure, blood glucose, and lipid levels, antiplatelet agents, obtaining regular exercise, and cessation of smoking.    The patient is aware that without maximal medical management the underlying atherosclerotic disease process will progress, limiting the benefit of any interventions. The patient is currently on a statin: Pravastatin The patient is currently on an anti-platelet: ASA.  Thank you for allowing [place] to participate in this patient's care.  Adele Barthel, MD [facility] Office: [phone] Pager: [phone]  03/23/2015, 11:43 AM

## 2015-04-02 NOTE — Interval H&P Note (Signed)
[facility]  History and Physical Update  The patient was interviewed and re-examined.  The patient's previous History and Physical has been reviewed and is unchanged from my consult.  There is no change in the plan of care: R carotid and cerebral angiogram.  I discussed with the patient the nature of angiographic procedures, especially the limited patencies of any endovascular intervention.  The patient is aware of that the risks of an angiographic procedure include but are not limited to: bleeding, infection, access site complications, renal failure, embolization, rupture of vessel, dissection, possible need for emergent surgical intervention, possible need for surgical procedures to treat the patient's pathology, anaphylactic reaction to contrast, and stroke and death.  The patient is aware of the risks and agrees to proceed.   Adele Barthel, MD [facility] Office: [phone] Pager: [phone]  04/02/2015, 10:34 AM

## 2015-04-02 NOTE — Progress Notes (Signed)
Site area:right groin a 5 french arterial sheath was removed  Site Prior to Removal:  Level 0  Pressure Applied For 20 MINUTES    Minutes Beginning at 1435p  Manual:   Yes.    Patient Status During Pull:  stable  Post Pull Groin Site:  Level 0  Post Pull Instructions Given:  Yes.    Post Pull Pulses Present:  Yes.    Dressing Applied:  Yes.    Comments:  VS remain stable during sheath pull. Pt denies any discomfort at site at this time.

## 2015-04-02 NOTE — Discharge Instructions (Signed)

## 2015-04-03 MED FILL — Lidocaine HCl Local Preservative Free (PF) Inj 1%: INTRAMUSCULAR | Qty: 30 | Status: AC

## 2015-04-03 MED FILL — Heparin Sodium (Porcine) 2 Unit/ML in Sodium Chloride 0.9%: INTRAMUSCULAR | Qty: 1000 | Status: AC

## 2015-04-05 ENCOUNTER — Encounter: Payer: Self-pay | Admitting: Neurology

## 2015-04-05 ENCOUNTER — Ambulatory Visit (INDEPENDENT_AMBULATORY_CARE_PROVIDER_SITE_OTHER): Payer: Medicare Other | Admitting: Neurology

## 2015-04-05 DIAGNOSIS — I63511 Cerebral infarction due to unspecified occlusion or stenosis of right middle cerebral artery: Secondary | ICD-10-CM

## 2015-04-05 DIAGNOSIS — I639 Cerebral infarction, unspecified: Secondary | ICD-10-CM | POA: Diagnosis not present

## 2015-04-05 NOTE — Progress Notes (Signed)
[facility]    Provider:  Dr Jaynee Eagles Referring Provider: Drenda Freeze, MD Primary Care Physician:  Haywood Pao, MD   CC:   stroke  HPI:  [name] is a 81 y.o. female here as a referral from Dr. Osborne Casco for  Follow-up after admission one month ago after acute onset left arm weakness and decreased sensation.  81 year old female with a history of hypertension, hyperlipidemia, chronic kidney disease , parathyroidism status post resection, breast cancer, tobacco abuse, who presented with left arm weakness and decreased sensation.  MRI showed right MCA embolic infarct secondary to right ICA stenosis greater than 80%.  Review of records showed that symptoms resolved on arrival to the emergency room , patient was not administered TPA secondary to symptoms resolved. Patient was admitted  For further evaluation in April 29 20 16th and imaging showed an acute infarct  Of the right MCA territory involving the right postcentral gyrus and right prefrontal gyrus. MRA of the head showed mild intracranial atherosclerosis but no  Intracranial stenosis or branch occlusion. Carotid Doppler did show greater than 80% internal carotid artery stenosis on the right.   patient returns today and is much improved.Her left arm is much better.  Her strength is improved and she denies sensory changes.She has gotten her strength back, no numbness or tingling. No smoking. Taking all her medications.  She is currently in POINT study and is taking the study medication. Discussed diet , cholesterol, vascular risk factors. Discussed things that she needs to do to try to prevent another stroke which is close management of vascular risk factors discussed with that means.    Reviewed notes, labs and imaging from outside physicians, which showed:   Ct Head Wo Contrast (personally reviewed images and agree with the following results.) 03/06/2015 1. Acute nonhemorrhagic infarcts involving the right  postcentral gyrus and right pre frontal gyrus. 2. Atherosclerosis. 3. Aspects score is 8/10   Mr Brain Wo Contrast (personally reviewed images and agree with the following results.) 03/07/2015  - Patchy acute infarct in the upper right MCA territory with scattered petechial hemorrhage. The volume of infarct is grossly stable from prior head CT.  - 11 mm extra-axial mass along the right cerebral convexity, appearance consistent with meningioma. In this patient with history breast cancer, 1 followup at 6 months is recommended to document stability.   [name] Head/brain Wo Cm 03/07/2015  No treatable intracranial stenosis or branch occlusion. Mild for age intracranial atherosclerosis, best visualized in the cavernous carotids and distal right PCA branches.   Carotid Doppler Right: Greater than 80% internal carotid artery stenosis. Left: 1-39% ICA stenosis. Bilateral: Vertebral artery flow is antegrade.   2D Echocardiogram  - Left ventricle: The cavity size was normal. Wall thickness wasincreased in a pattern of mild LVH. The estimated ejectionfraction was 65%. Wall motion was normal; there were no regionalwall motion abnormalities. - Right ventricle: The cavity size was normal. Systolic functionwas normal. Impressions: No cardiac source of embolism was identified, butcannot be ruled out on the basis of this examination.  CTA head  03/08/2015 1. Evolving acute patchy right MCA territory ischemic infarcts, overall stable in distribution as compared to prior MRI. No evidence for hemorrhagic transformation. Patchy post-contrast enhancement about the areas of infarction likely related to blood brain barrier breakdown and small petechial hemorrhage as seen on previous MRI. 2. No focal hemodynamically significant stenosis identified within the intracranial circulation. 3. Subtle attenuation within the distal right MCA artery branches as compared to  the left, likely related to  the acute right MCA territory infarcts. 4. Multi focal calcified atheromatous plaque within the cavernous carotid arteries bilaterally without focal high-grade stenosis. 5. 14 mm homogeneously enhancing mass overlying the right cerebral convexity. Finding most likely reflects a meningioma. However, given the history of breast cancer, a follow-up examination in 6 months is suggested to ensure stability.  CTA of the neck 03/08/2015 1. Calcified and noncalcified atheromatous plaque within the proximal right ICA with associated severe high-grade stenosis of approximately 90% by NASCET criteria. This stenosis begins at the right carotid bifurcation, and measures approximately 14 mm in length. Right ICA is well opacified distally to the skullbase. 2. Calcified atheromatous disease within the proximal left ICA with associated short segment stenosis of approximately 40% by NASCET criteria. This stenosis begins at the left carotid bifurcation, and measures approximately 13 mm in length. Left ICA is well opacified distally to the skullbase. 3. Calcified plaque at the origin of the vertebral arteries without high-grade stenosis. Vertebral arteries are otherwise widely patent to the skullbase.   Review of Systems: Patient complains of symptoms per HPI as well as the following symptoms  Rectal bleeding twice , red blood on the toilet paper likely local or hemorrhoidal bleeding. Pertinent negatives per HPI. All others negative.   History   Social History  . Marital Status: Widowed    Spouse Name: N/A  . Number of Children: 11  . Years of Education: 12+   Occupational History  . Not on file.   Social History Main Topics  . Smoking status: Passive Smoke Exposure - Never Smoker  . Smokeless tobacco: Never Used     Comment: "When using the bathroom"  . Alcohol Use: Yes     Comment: Glass of wine on holidays.  . Drug Use: No  . Sexual Activity: Not on file   Other Topics Concern  .  Not on file   Social History Narrative   Lives at home with her two sons and grandson.   Caffeine use: Drinks 1 cup coffee per day   Drinks 2 cans soda per day       Right handed    Family History  Problem Relation Age of Onset  . Stroke Mother   . Hypertension Mother   . Stroke Father     Past Medical History  Diagnosis Date  . Hypertension   . Cataract   . Parathyroid disease     Clarify with patient. No detail indicated on history form.  . Cancer     breast  . Renal insufficiency     Past Surgical History  Procedure Laterality Date  . Parathyroidectomy  June 2010  . Breast lumpectomy  2008    Confirm type with patient. Not indicated on medical history form dated 12/26/10.  . Peripheral vascular catheterization Right 04/02/2015    Procedure: Carotid Angiography;  Surgeon: Conrad Middleton, MD;  Location: [facility];  Service: Cardiovascular;  Laterality: Right;    Current Outpatient Prescriptions  Medication Sig Dispense Refill  . aspirin EC 81 MG EC tablet Take 1 tablet (81 mg total) by mouth daily. 30 tablet 6  . cholecalciferol (VITAMIN D) 400 UNITS TABS tablet Take 400 Units by mouth daily.    . felodipine (PLENDIL) 10 MG 24 hr tablet Take 10 mg by mouth daily.    . ferrous sulfate 325 (65 FE) MG tablet Take 325 mg by mouth daily with breakfast.    . ibandronate (BONIVA)  150 MG tablet Take 150 mg by mouth every 30 (thirty) days. Pt takes on 6th --Take in the morning with a full glass of water, on an empty stomach, and do not take anything else by mouth or lie down for the next 30 min.    Marland Kitchen losartan (COZAAR) 25 MG tablet Take 25 mg by mouth daily.    . metoprolol succinate (TOPROL-XL) 50 MG 24 hr tablet Take 50 mg by mouth daily.     . potassium chloride SA (K-DUR,KLOR-CON) 20 MEQ tablet Take 20 mEq by mouth 2 (two) times daily.    . pravastatin (PRAVACHOL) 40 MG tablet Take 40 mg by mouth daily.    . research study medication Take 0.015 each (75 mg total) by  mouth daily with breakfast.    . ranitidine (ZANTAC) 150 MG/10ML syrup Take 10 mLs (150 mg total) by mouth 2 (two) times daily. (Patient not taking: Reported on 03/23/2015) 300 mL 0   No current facility-administered medications for this visit.    Allergies as of 04/05/2015  . (No Known Allergies)    Vitals: BP 144/72 mmHg  Pulse 64  Temp(Src) 97.8 F (36.6 C)  Ht 5\' 3"  (1.6 m)  Wt 113 lb 9.6 oz (51.529 kg)  BMI 20.13 kg/m2 Last Weight:  Wt Readings from Last 1 Encounters:  04/05/15 113 lb 9.6 oz (51.529 kg)   Last Height:   Ht Readings from Last 1 Encounters:  04/05/15 5\' 3"  (1.6 m)   Physical exam: Exam: Gen: NAD, conversant, , well groomed                     CV: RRR, no MRG. No Carotid Bruits. No peripheral edema, warm, nontender Eyes: Conjunctivae clear without exudates or hemorrhage  Neuro: Detailed Neurologic Exam  Speech:    Speech is normal; fluent and spontaneous with normal comprehension.  Cognition:    The patient is oriented to person, place, and time;     recent and remote memory intact;     language fluent;     normal attention, concentration,     fund of knowledge Cranial Nerves:    The pupils are equal, round, and reactive to light. The fundi are flat. Visual fields are full to finger confrontation. Extraocular movements are intact. Trigeminal sensation is intact and the muscles of mastication are normal. The face is symmetric. The palate elevates in the midline. Hearing intact. Voice is normal. Shoulder shrug is normal. The tongue has normal motion without fasciculations.   Coordination:    Normal finger to nose and heel to shin.  No drift.  Gait:     Gait without ataxia  Motor Observation:    No asymmetry, no atrophy, and no involuntary movements noted. Tone:    Normal muscle tone.    Posture:    Posture is normal. normal erect    Strength:    Strength is V/V in the upper and lower limbs.      Sensation: intact to LT     Reflex  Exam:  DTR's:    Deep tendon reflexes in the upper and lower extremities are  1+ and symmetrical bilaterally.   Toes:    The toes are downgoing bilaterally.   Clonus:    Clonus is absent.       Assessment/Plan:   81 year old female with a history of hypertension, hyperlipidemia, chronic kidney disease , parathyroidism status post resection, breast cancer, tobacco abuse, who presented with left arm weakness and  decreased sensation.  MRI showed right MCA embolic infarct secondary to right ICA stenosis greater than 80%. She is enrolled in the POINT trial.  Neuro exam today is nonfocal.   2D Echo No source of embolus   LDL 56  HgbA1c 6.0   continue aspirin 81 mg daily along with investigational drug which may or may not be Plavix.  Please contact [facility] at [phone] for any questions  About the trial   Follow with vascular surgery regarding right carotid artery blockage.  POINT Investigational drug can be discontinued 5 days before the CEA procedure and resumed after the procedure.   If CAS planned, then pt may need to be withdrawn from the study due to need of plavix use.  Avoid hypotension   continue statin   patient has stopped smoking   MRI with and without contrast in 6 months is recommended to follow likely right  Cerebral convexity meningioma   follow-up with Dr. Leonie Man for POINT trial  Sarina Ill, MD  [facility] [address] [place], [place] [address]  Phone [phone] Fax [phone]

## 2015-04-11 ENCOUNTER — Telehealth: Payer: Self-pay | Admitting: Vascular Surgery

## 2015-04-11 NOTE — Telephone Encounter (Signed)
Unable to reach pt, no voicemail. Sent appt letter, dpm

## 2015-04-11 NOTE — Telephone Encounter (Signed)
-----   Message from Mena Goes, RN sent at 04/06/2015  2:51 PM EDT ----- Regarding: Schedule   ----- Message -----    From: Conrad [place], MD    Sent: 04/06/2015   1:51 PM      To: [address]  [name] [id number] January 03, 1931  Pt needs to be scheduled with Dr. Oneida Alar for pre-op counseling for carotid stents.

## 2015-04-16 ENCOUNTER — Encounter: Payer: Self-pay | Admitting: Vascular Surgery

## 2015-04-17 ENCOUNTER — Encounter: Payer: Self-pay | Admitting: Vascular Surgery

## 2015-04-18 ENCOUNTER — Ambulatory Visit (INDEPENDENT_AMBULATORY_CARE_PROVIDER_SITE_OTHER): Payer: Medicare Other | Admitting: Vascular Surgery

## 2015-04-18 ENCOUNTER — Encounter: Payer: Self-pay | Admitting: Vascular Surgery

## 2015-04-18 VITALS — BP 147/61 | HR 71 | Ht 63.0 in | Wt 121.0 lb

## 2015-04-18 DIAGNOSIS — I6521 Occlusion and stenosis of right carotid artery: Secondary | ICD-10-CM | POA: Diagnosis not present

## 2015-04-18 DIAGNOSIS — I639 Cerebral infarction, unspecified: Secondary | ICD-10-CM | POA: Diagnosis not present

## 2015-04-18 NOTE — Progress Notes (Signed)
Established Carotid Patient  History of Present Illness Patient is an 81 year old female who was previously seen by my partner Dr. Bridgett Larsson several weeks ago. She was seen for asymptomatic right internal carotid artery stenosis. She had a stroke in April 2016 with left-sided weakness. She recently underwent carotid angiogram which again showed a greater than 90% right internal carotid artery stenosis. This was a very high lesion and she was referred to me for possible carotid stenting.  She is able to walk without assistance and her left arm weakness is nearly resolved.  She has no further episodes of L sided weakness.  She is currently on Plavix and aspirin.  Past Medical History  Diagnosis Date  . Hypertension   . Cataract   . Parathyroid disease     Clarify with patient. No detail indicated on history form.  . Cancer     breast  . Renal insufficiency    Past Surgical History  Procedure Laterality Date  . Parathyroidectomy  June 2010  . Breast lumpectomy  2008    Confirm type with patient. Not indicated on medical history form dated 12/26/10.  . Peripheral vascular catheterization Right 04/02/2015    Procedure: Carotid Angiography;  Surgeon: Conrad Ross, MD;  Location: [facility];  Service: Cardiovascular;  Laterality: Right;   Current Outpatient Prescriptions on File Prior to Visit  Medication Sig Dispense Refill  . aspirin EC 81 MG EC tablet Take 1 tablet (81 mg total) by mouth daily. 30 tablet 6  . cholecalciferol (VITAMIN D) 400 UNITS TABS tablet Take 400 Units by mouth daily.    . felodipine (PLENDIL) 10 MG 24 hr tablet Take 10 mg by mouth daily.    . ferrous sulfate 325 (65 FE) MG tablet Take 325 mg by mouth daily with breakfast.    . ibandronate (BONIVA) 150 MG tablet Take 150 mg by mouth every 30 (thirty) days. Pt takes on 6th --Take in the morning with a full glass of water, on an empty stomach, and do not take anything else by mouth or lie down for the next 30 min.     Marland Kitchen losartan (COZAAR) 25 MG tablet Take 25 mg by mouth daily.    . metoprolol succinate (TOPROL-XL) 50 MG 24 hr tablet Take 50 mg by mouth daily.     . potassium chloride SA (K-DUR,KLOR-CON) 20 MEQ tablet Take 20 mEq by mouth 2 (two) times daily.    . pravastatin (PRAVACHOL) 40 MG tablet Take 40 mg by mouth daily.    . ranitidine (ZANTAC) 150 MG/10ML syrup Take 10 mLs (150 mg total) by mouth 2 (two) times daily. 300 mL 0  . research study medication Take 0.015 each (75 mg total) by mouth daily with breakfast.     No current facility-administered medications on file prior to visit.   Plavix 75 mg once daily  No Known Allergies   On ROS today: mild L arm weakness, no ocular sx, normal gait  Physical Examination    Filed Vitals:   04/18/15 0855 04/18/15 0856  BP: 142/59 147/61  Pulse: 71   Height: 5\' 3"  (1.6 m)   Weight: 54.885 kg (121 lb)   SpO2: 100%    General: A&O x 3, WD, thin  Neck: Soft right carotid bruit  Pulmonary: Clear to auscultation bilaterally  Cardiac: RRR, no Murmurs, rubs or gallops  Vascular: Vessel  Right  Left   Radial  Palpable  Palpable   Brachial  Palpable  Palpable   Carotid  Palpable, without bruit  Palpable, without bruit   Aorta  Not palpable  N/A   Femoral  Palpable  Palpable   Popliteal  Not palpable  Not palpable   PT  Palpable  Palpable   DP  FaintlyPalpable  Faintly Palpable    Musculoskeletal: M/S 5/5 throughout but R>L, Extremities without ischemic changes   Neurologic: CN 2-12 intact , Pain and light touch intact in extremities , Motor exam as listed above  Assessment: Greater than 90% stenosis right internal carotid artery. I reviewed her angiogram images and her arch and the lesion do seem amenable to carotid stenting. The patient does have a high carotid bifurcation and the lesion extends well above the angle of the mandible.  Plan: Risks benefits possible palpitations and procedure details were discussed with the patient and her  daughter today. These include but are not limited to bleeding infection stroke risk of contrast reaction. The patient will continue her Plavix and aspirin. She is scheduled for right carotid stenting on 05/11/2015.  Ruta Hinds, MD [facility] Office: [phone] Pager: [phone]

## 2015-04-25 ENCOUNTER — Other Ambulatory Visit: Payer: Self-pay

## 2015-04-25 MED ORDER — CLOPIDOGREL BISULFATE 75 MG PO TABS
75.0000 mg | ORAL_TABLET | Freq: Every day | ORAL | Status: DC
Start: 2015-04-25 — End: 2016-05-26

## 2015-04-25 NOTE — Progress Notes (Signed)
Phone call to pt. To discuss scheduling procedure for Right Carotid Stent on 05/11/15.  Advised to continue ASA and Plavix as instructed per Dr. Oneida Alar up to and including the day of the procedure.  Pt. Stated she didn't remember getting a prescription for Plavix.  Advised will fax an Rx to her pharmacy, and to start taking Plavix 75 mg.,1 tablet daily.   Also, advised pt. will mail her pre-procedure instructions to her.  Verb. Understanding

## 2015-04-30 NOTE — Consult Note (Signed)
NAMEDARNEISHA, WINDHORST                  ACCOUNT NO.:  [id number]  MEDICAL RECORD NO.:  [id number]  LOCATION:  [facility]                         FACILITY:  [facility]  PHYSICIAN:  Teliah Buffalo K. Kage Willmann, M.D.DATE OF BIRTH:  [birth year]  DATE OF CONSULTATION:  04/02/2015 DATE OF DISCHARGE:  04/02/2015                                CONSULTATION   CLINICAL HISTORY:  Right carotid artery stenosis.  EXAMINATION:  Intracranial interpretation of right common carotid arteriogram.  FINDINGS:  The right common carotid arteriogram demonstrates  wide patency of the right internal carotid artery  in its visualized cervical portion, the petrous and cavernous, and the supraclinoid portions.  The right middle cerebral artery demonstrates  a focal segmental irregularity just distal to the origin of the anterior temporal branch with a possible 50% narrowing. Distally the right MCA branches appear to be widely patent.  There is  slow antegrade opacification of the right anterior cerebral artery with inflow,  Of unopacified  blood from the contralateral  internal carotid  artery.  This is probably secondary to the severe _ proximal stenosis in the right internal carotid artery .  IMPRESSION:   Wide patency of the right internal carotid artery  at the  intracranial skull base.  Suggestion of a possible 50% narrowing of the right middle cerebral artery   just distal to the origin of the anterior temporal branch probably  due to intracranial arteriosclerosis.  Poor  opacifation  of right anterior cerebral artery A-1 segment, probably related to  The severe  proximal stenosis of the right internal carotid artery, with cross filling  from  contralateral left ICA via the anterior communicating artery.          ______________________________ Fritz Pickerel Estanislado Pandy, M.D.     SKD/MEDQ  D:  04/27/2015  T:  04/28/2015  Job:  [id number]

## 2015-05-08 ENCOUNTER — Ambulatory Visit
Admission: RE | Admit: 2015-05-08 | Discharge: 2015-05-08 | Disposition: A | Discharge status: Home / Self Care | Payer: Medicare Other | Source: Ambulatory Visit

## 2015-05-08 DIAGNOSIS — Z1231 Encounter for screening mammogram for malignant neoplasm of breast: Secondary | ICD-10-CM

## 2015-05-10 MED ORDER — SODIUM CHLORIDE 0.9 % IV SOLN: INTRAVENOUS | Status: DC

## 2015-05-11 ENCOUNTER — Telehealth: Payer: Self-pay

## 2015-05-11 ENCOUNTER — Other Ambulatory Visit: Payer: Self-pay

## 2015-05-11 ENCOUNTER — Encounter (HOSPITAL_COMMUNITY)
Admission: RE | Disposition: A | Discharge status: Home / Self Care | Payer: Medicare Other | Source: Ambulatory Visit | Attending: Vascular Surgery

## 2015-05-11 ENCOUNTER — Ambulatory Visit (HOSPITAL_COMMUNITY)
Admission: RE | Admit: 2015-05-11 | Discharge: 2015-05-12 | Disposition: A | Discharge status: Home / Self Care | Payer: Medicare Other | Source: Ambulatory Visit | Attending: Vascular Surgery | Admitting: Vascular Surgery

## 2015-05-11 DIAGNOSIS — I10 Essential (primary) hypertension: Secondary | ICD-10-CM | POA: Insufficient documentation

## 2015-05-11 DIAGNOSIS — I6521 Occlusion and stenosis of right carotid artery: Secondary | ICD-10-CM

## 2015-05-11 DIAGNOSIS — Z8673 Personal history of transient ischemic attack (TIA), and cerebral infarction without residual deficits: Secondary | ICD-10-CM | POA: Insufficient documentation

## 2015-05-11 DIAGNOSIS — Z7982 Long term (current) use of aspirin: Secondary | ICD-10-CM | POA: Insufficient documentation

## 2015-05-11 DIAGNOSIS — Z853 Personal history of malignant neoplasm of breast: Secondary | ICD-10-CM | POA: Insufficient documentation

## 2015-05-11 DIAGNOSIS — I6523 Occlusion and stenosis of bilateral carotid arteries: Secondary | ICD-10-CM | POA: Diagnosis present

## 2015-05-11 DIAGNOSIS — Z7902 Long term (current) use of antithrombotics/antiplatelets: Secondary | ICD-10-CM | POA: Insufficient documentation

## 2015-05-11 DIAGNOSIS — Z48812 Encounter for surgical aftercare following surgery on the circulatory system: Secondary | ICD-10-CM

## 2015-05-11 HISTORY — PX: CAROTID STENT: SHX1301

## 2015-05-11 HISTORY — PX: PERIPHERAL VASCULAR CATHETERIZATION: SHX172C

## 2015-05-11 LAB — POCT I-STAT, CHEM 8
BUN: 24 mg/dL — ABNORMAL HIGH (ref 6–20)
CREATININE: 1.2 mg/dL — AB (ref 0.44–1.00)
Calcium, Ion: 1.17 mmol/L (ref 1.13–1.30)
Chloride: 109 mmol/L (ref 101–111)
GLUCOSE: 97 mg/dL (ref 65–99)
HEMATOCRIT: 37 % (ref 36.0–46.0)
Hemoglobin: 12.6 g/dL (ref 12.0–15.0)
Potassium: 3.2 mmol/L — ABNORMAL LOW (ref 3.5–5.1)
Sodium: 145 mmol/L (ref 135–145)
TCO2: 25 mmol/L (ref 0–100)

## 2015-05-11 LAB — POCT ACTIVATED CLOTTING TIME: ACTIVATED CLOTTING TIME: 282 s

## 2015-05-11 SURGERY — CAROTID PTA/STENT INTERVENTION

## 2015-05-11 MED ORDER — MAGNESIUM SULFATE 2 GM/50ML IV SOLN: 2.0000 g | Freq: Every day | INTRAVENOUS | Status: DC | PRN

## 2015-05-11 MED ORDER — DOCUSATE SODIUM 100 MG PO CAPS
100.0000 mg | ORAL_CAPSULE | Freq: Every day | ORAL | Status: DC
  Administered 2015-05-12: 100 mg via ORAL
  Filled 2015-05-11: qty 1

## 2015-05-11 MED ORDER — CLOPIDOGREL BISULFATE 75 MG PO TABS: 75.0000 mg | ORAL_TABLET | Freq: Every day | ORAL | Status: DC

## 2015-05-11 MED ORDER — POTASSIUM CHLORIDE CRYS ER 20 MEQ PO TBCR: 20.0000 meq | EXTENDED_RELEASE_TABLET | Freq: Every day | ORAL | Status: DC | PRN

## 2015-05-11 MED ORDER — ATROPINE SULFATE 0.1 MG/ML IJ SOLN
INTRAMUSCULAR | Status: AC
  Filled 2015-05-11: qty 10

## 2015-05-11 MED ORDER — CHOLECALCIFEROL 10 MCG (400 UNIT) PO TABS
400.0000 [IU] | ORAL_TABLET | Freq: Every day | ORAL | Status: DC
  Administered 2015-05-12: 400 [IU] via ORAL
  Filled 2015-05-11: qty 1

## 2015-05-11 MED ORDER — LOSARTAN POTASSIUM 25 MG PO TABS
25.0000 mg | ORAL_TABLET | Freq: Every day | ORAL | Status: DC
  Administered 2015-05-12: 25 mg via ORAL
  Filled 2015-05-11: qty 1

## 2015-05-11 MED ORDER — BIVALIRUDIN 250 MG IV SOLR
250.0000 mg | INTRAVENOUS | Status: DC | PRN
  Administered 2015-05-11: 1.75 mg/kg/h via INTRAVENOUS

## 2015-05-11 MED ORDER — MORPHINE SULFATE 2 MG/ML IJ SOLN: 2.0000 mg | INTRAMUSCULAR | Status: DC | PRN

## 2015-05-11 MED ORDER — BIVALIRUDIN 250 MG IV SOLR
INTRAVENOUS | Status: AC
  Filled 2015-05-11: qty 250

## 2015-05-11 MED ORDER — ATROPINE SULFATE 0.1 MG/ML IJ SOLN
INTRAMUSCULAR | Status: DC | PRN
  Administered 2015-05-11: 0.5 mg via INTRAVENOUS

## 2015-05-11 MED ORDER — PHENOL 1.4 % MT LIQD: 1.0000 | OROMUCOSAL | Status: DC | PRN

## 2015-05-11 MED ORDER — POTASSIUM CHLORIDE CRYS ER 20 MEQ PO TBCR
30.0000 meq | EXTENDED_RELEASE_TABLET | Freq: Once | ORAL | Status: AC
  Administered 2015-05-11: 30 meq via ORAL
  Filled 2015-05-11: qty 1

## 2015-05-11 MED ORDER — FERROUS SULFATE 325 (65 FE) MG PO TABS
325.0000 mg | ORAL_TABLET | Freq: Every day | ORAL | Status: DC
  Administered 2015-05-12: 325 mg via ORAL
  Filled 2015-05-11 (×2): qty 1

## 2015-05-11 MED ORDER — CLOPIDOGREL BISULFATE 75 MG PO TABS
75.0000 mg | ORAL_TABLET | ORAL | Status: AC
  Administered 2015-05-11: 75 mg via ORAL
  Filled 2015-05-11: qty 1

## 2015-05-11 MED ORDER — METOPROLOL TARTRATE 1 MG/ML IV SOLN: 2.0000 mg | INTRAVENOUS | Status: DC | PRN

## 2015-05-11 MED ORDER — PRAVASTATIN SODIUM 40 MG PO TABS
40.0000 mg | ORAL_TABLET | Freq: Every day | ORAL | Status: DC
  Administered 2015-05-11: 40 mg via ORAL
  Filled 2015-05-11 (×2): qty 1

## 2015-05-11 MED ORDER — NOREPINEPHRINE BITARTRATE 1 MG/ML IV SOLN
INTRAVENOUS | Status: AC
  Filled 2015-05-11: qty 4

## 2015-05-11 MED ORDER — LABETALOL HCL 5 MG/ML IV SOLN: 10.0000 mg | INTRAVENOUS | Status: DC | PRN

## 2015-05-11 MED ORDER — CLOPIDOGREL BISULFATE 75 MG PO TABS
75.0000 mg | ORAL_TABLET | Freq: Every day | ORAL | Status: DC
  Administered 2015-05-12: 75 mg via ORAL
  Filled 2015-05-11: qty 1

## 2015-05-11 MED ORDER — ACETAMINOPHEN 650 MG RE SUPP
325.0000 mg | RECTAL | Status: DC | PRN
Start: 2015-05-11 — End: 2015-05-12

## 2015-05-11 MED ORDER — SODIUM CHLORIDE 0.45 % IV SOLN
INTRAVENOUS | Status: DC
  Administered 2015-05-11: 09:00:00 via INTRAVENOUS

## 2015-05-11 MED ORDER — POTASSIUM CHLORIDE CRYS ER 20 MEQ PO TBCR
20.0000 meq | EXTENDED_RELEASE_TABLET | Freq: Two times a day (BID) | ORAL | Status: DC
  Administered 2015-05-11 – 2015-05-12 (×2): 20 meq via ORAL
  Filled 2015-05-11 (×3): qty 1

## 2015-05-11 MED ORDER — LIDOCAINE HCL (PF) 1 % IJ SOLN
INTRAMUSCULAR | Status: AC
  Filled 2015-05-11: qty 30

## 2015-05-11 MED ORDER — STUDY - INVESTIGATIONAL DRUG SIMPLE RECORD: 75.0000 mg | Freq: Every day | Status: DC

## 2015-05-11 MED ORDER — HYDRALAZINE HCL 20 MG/ML IJ SOLN: 5.0000 mg | INTRAMUSCULAR | Status: DC | PRN

## 2015-05-11 MED ORDER — ACETAMINOPHEN 325 MG PO TABS: 325.0000 mg | ORAL_TABLET | ORAL | Status: DC | PRN

## 2015-05-11 MED ORDER — GUAIFENESIN-DM 100-10 MG/5ML PO SYRP: 15.0000 mL | ORAL_SOLUTION | ORAL | Status: DC | PRN

## 2015-05-11 MED ORDER — LIDOCAINE HCL (PF) 1 % IJ SOLN
INTRAMUSCULAR | Status: DC | PRN
  Administered 2015-05-11: 15 mL

## 2015-05-11 MED ORDER — HEPARIN (PORCINE) IN NACL 2-0.9 UNIT/ML-% IJ SOLN
INTRAMUSCULAR | Status: AC
  Filled 2015-05-11: qty 1000

## 2015-05-11 MED ORDER — METOPROLOL SUCCINATE ER 50 MG PO TB24
50.0000 mg | ORAL_TABLET | Freq: Every day | ORAL | Status: DC
  Administered 2015-05-12: 50 mg via ORAL
  Filled 2015-05-11: qty 1

## 2015-05-11 MED ORDER — IODIXANOL 320 MG/ML IV SOLN
INTRAVENOUS | Status: DC | PRN
  Administered 2015-05-11: 75 mL via INTRA_ARTERIAL

## 2015-05-11 MED ORDER — BIVALIRUDIN BOLUS VIA INFUSION - CUPID
INTRAVENOUS | Status: DC | PRN
  Administered 2015-05-11: 40.8 mg via INTRAVENOUS

## 2015-05-11 MED ORDER — PANTOPRAZOLE SODIUM 40 MG PO TBEC
40.0000 mg | DELAYED_RELEASE_TABLET | Freq: Every day | ORAL | Status: DC
  Administered 2015-05-11 – 2015-05-12 (×2): 40 mg via ORAL
  Filled 2015-05-11 (×4): qty 1

## 2015-05-11 MED ORDER — FELODIPINE ER 10 MG PO TB24
10.0000 mg | ORAL_TABLET | Freq: Every day | ORAL | Status: DC
  Administered 2015-05-11 – 2015-05-12 (×2): 10 mg via ORAL
  Filled 2015-05-11 (×2): qty 1

## 2015-05-11 MED ORDER — ASPIRIN EC 81 MG PO TBEC
81.0000 mg | DELAYED_RELEASE_TABLET | Freq: Every day | ORAL | Status: DC
  Administered 2015-05-12: 81 mg via ORAL
  Filled 2015-05-11: qty 1

## 2015-05-11 MED ORDER — ALUM & MAG HYDROXIDE-SIMETH 200-200-20 MG/5ML PO SUSP: 15.0000 mL | ORAL | Status: DC | PRN

## 2015-05-11 MED ORDER — ONDANSETRON HCL 4 MG/2ML IJ SOLN: 4.0000 mg | Freq: Four times a day (QID) | INTRAMUSCULAR | Status: DC | PRN

## 2015-05-11 SURGICAL SUPPLY — 21 items
BALLN EMERGE MR 3.0X20 (BALLOONS) ×2
BALLN VIATRAC 5X20X135 (BALLOONS) ×2
BALLOON EMERGE MR 3.0X20 (BALLOONS) ×1 IMPLANT
BALLOON VIATRAC 5X20X135 (BALLOONS) ×1 IMPLANT
CATH ANGIO 5F PIGTAIL 100CM (CATHETERS) ×2 IMPLANT
CATH HEADHUNTER 5F 125CM (CATHETERS) ×2 IMPLANT
COVER PRB 48X5XTLSCP FOLD TPE (BAG) ×1 IMPLANT
COVER PROBE 5X48 (BAG) ×2
DEVICE EMBOSHIELD NAV6 4.0-7.0 (WIRE) ×2 IMPLANT
KIT ENCORE 26 ADVANTAGE (KITS) ×2 IMPLANT
KIT PV (KITS) ×2 IMPLANT
SHEATH PINNACLE 5F 10CM (SHEATH) ×2 IMPLANT
SHEATH PINNACLE 6F 10CM (SHEATH) ×2 IMPLANT
SHEATH SHUTTLE SELECT 6F (SHEATH) ×2 IMPLANT
STENT XACT CAR 9-7X30X136 (Permanent Stent) ×2 IMPLANT
SYR MEDRAD MARK V 150ML (SYRINGE) ×2 IMPLANT
TRANSDUCER W/STOPCOCK (MISCELLANEOUS) ×2 IMPLANT
TRAY PV CATH (CUSTOM PROCEDURE TRAY) ×2 IMPLANT
WIRE AMPLATZ SS-J .035X260CM (WIRE) ×2 IMPLANT
WIRE AMPLATZ SSTIFF .035X260CM (WIRE) ×2 IMPLANT
WIRE HITORQ VERSACORE ST 145CM (WIRE) ×2 IMPLANT

## 2015-05-11 NOTE — Progress Notes (Signed)
UR COMPLETED  

## 2015-05-11 NOTE — Op Note (Addendum)
Procedure: Righ carotid angiogram, right carotid angioplasty and stenting   Preoperative diagnosis: High-grade symptomatic right internal carotid artery stenosis  Postoperative diagnosis: Same   Anesthesia: Local  Co-surgeon: Quay Burow M.D.   Operative findings: 1. Right femoral sheath  2.  Right ICA stenosis treated 99% to 0% residual with Abbott xact 7 x 9 x 30 stent  Operative details: After obtaining informed consent, the patient was taken to the [facility] lab. The patient was placed in supine position the Angio table. Both groins were prepped and draped in usual sterile fashion. Local anesthesia was infiltrated over the right common femoral artery. Ultrasound was used to locate the right common femoral artery.  An introducer needle was placed into the right common femoral artery and there was good backbleeding from this. A 0.035 Versacore wire was then threaded up the abdominal aorta under fluoroscopic guidance. A 5 Fr sheath was placed over the guidewire into the right common femoral artery.  This was thoroughly flushed with heparinized saline.  A 5 Fr H1 catheter was placed over the wire and the wire advanced into the innominate followed by the right common carotid artery and this was confirmed by contrast angiogram.  There is a 99% stenosis of the right internal carotid with moderate calcification.  The external carotid is patent.  Measurements were taken of the proximal distal and length of the lesion.  7 x 9 x 30 Abbott xact stent was selected.  An 035 Amplatz wire was then placed throught the H1 catheter and advanced to just below the carotid bifurcation.  A 6 French Cook shuttle sheath was then brought up on the operative field and advanced over the guidewire up into the right common carotid artery.  This was advanced over the dilator to the distal right common carotid artery. Angiomax bolus was given. ACT was also checked and was confirmed > 200. At this point an Abbott 7 mm filter was brought  on the operative field and this was advanced up to the level of the carotid stenosis.  With some manipulation the guidewire advanced through the stenosis with minimal difficulty. The filter was then advanced into the distal internal carotid artery at the level of the skull base. The filter was then deployed in this location. At this point a repeat contrast angiogram was performed to again determined the level of the stenosis. A 3 x 2 angioplasty balloon was brought in operative field and advanced to the level of stenosis and quickly inflated and deflated to 6 atmospheres. The predilatation balloon was removed. A 7 x 9 x 30 Abbott xact stent was then brought in the operative field and advanced to the level of stenosis. This would not pass intially due to friction so the stent was removed and the 3 x 2 balloon again used to predilate to 6 atm.  The stent was then readvanced and crossed the lesion after we were able to get the patient to turn her head.  Again using angiographic guidance, the stent was deployed so that the tip of the stent was past the level of the high-grade stenosis. The stent was then deployed . A postdilatation was then performed after the stent delivery device was removed. This was done with a 5 x 2 balloon inflated to 10 atmospheres up and down.  There was brief bradycardia which quickly resolved with 1 mg atropine. A completion arteriogram was performed which showed that the residual stenosis was essentially 0 and the stent had good wall apposition.  The  filter was retrieved Completion intracranial views were also performed in AP and lateral projection to be interpreted later by the neuroradiologist. The patient tolerated procedure well and there were no complications. After the completion arteriogram had been performed, the sheath was pulled back over a guidewire and these were then pulled down to the guidewire and the sheath exchanged for a 6 French short sheath in the right femoral artery.  This was thoroughly flushed with heparinized saline and was left in place to be pulled in the holding area after the ACT is less than 175.The patient tolerated the procedure well and there were no neurologic complications. The patient was transported to the holding area in stable condition.   Ruta Hinds, MD  [facility]  Office: [phone]  Pager: [phone]

## 2015-05-11 NOTE — H&P (View-Only) (Signed)
[name] Patient  History of Present Illness Patient is an 81 year old female who was previously seen by my partner Dr. Bridgett Larsson several weeks ago. She was seen for asymptomatic right internal Johnson artery stenosis. She had a stroke in April 2016 with left-sided weakness. She recently underwent Johnson angiogram which again showed a greater than 90% right internal Johnson artery stenosis. This was a very high lesion and she was referred to me for possible Johnson stenting.  She is able to walk without assistance and her left arm weakness is nearly resolved.  She has no further episodes of L sided weakness.  She is currently on Plavix and aspirin.  Past Medical History  Diagnosis Date  . Hypertension   . Cataract   . Parathyroid disease     Clarify with patient. No detail indicated on history form.  . Cancer     breast  . Renal insufficiency    Past Surgical History  Procedure Laterality Date  . Parathyroidectomy  June 2010  . Breast lumpectomy  2008    Confirm type with patient. Not indicated on medical history form dated 12/26/10.  . Peripheral vascular catheterization Right 04/02/2015    Procedure: Johnson Angiography;  Surgeon: Conrad Wellford, MD;  Location: [facility];  Service: Cardiovascular;  Laterality: Right;   Current Outpatient Prescriptions on File Prior to Visit  Medication Sig Dispense Refill  . aspirin EC 81 MG EC tablet Take 1 tablet (81 mg total) by mouth daily. 30 tablet 6  . cholecalciferol (VITAMIN D) 400 UNITS TABS tablet Take 400 Units by mouth daily.    . felodipine (PLENDIL) 10 MG 24 hr tablet Take 10 mg by mouth daily.    . ferrous sulfate 325 (65 FE) MG tablet Take 325 mg by mouth daily with breakfast.    . ibandronate (BONIVA) 150 MG tablet Take 150 mg by mouth every 30 (thirty) days. Pt takes on 6th --Take in the morning with a full glass of water, on an empty stomach, and do not take anything else by mouth or lie down for the next 30 min.     Marland Kitchen losartan (COZAAR) 25 MG tablet Take 25 mg by mouth daily.    . metoprolol succinate (TOPROL-XL) 50 MG 24 hr tablet Take 50 mg by mouth daily.     . potassium chloride SA (K-DUR,KLOR-CON) 20 MEQ tablet Take 20 mEq by mouth 2 (two) times daily.    . pravastatin (PRAVACHOL) 40 MG tablet Take 40 mg by mouth daily.    . ranitidine (ZANTAC) 150 MG/10ML syrup Take 10 mLs (150 mg total) by mouth 2 (two) times daily. 300 mL 0  . research study medication Take 0.015 each (75 mg total) by mouth daily with breakfast.     No current facility-administered medications on file prior to visit.   Plavix 75 mg once daily  No Known Allergies   On ROS today: mild L arm weakness, no ocular sx, normal gait  Physical Examination    Filed Vitals:   04/18/15 0855 04/18/15 0856  BP: 142/59 147/61  Pulse: 71   Height: 5\' 3"  (1.6 m)   Weight: 54.885 kg (121 lb)   SpO2: 100%    General: A&O x 3, WD, thin  Neck: Soft right Johnson bruit  Pulmonary: Clear to auscultation bilaterally  Cardiac: RRR, no Murmurs, rubs or gallops  Vascular: Vessel  Right  Left   Radial  Palpable  Palpable   Brachial  Palpable  Palpable   Johnson  Palpable, without bruit  Palpable, without bruit   Aorta  Not palpable  N/A   Femoral  Palpable  Palpable   Popliteal  Not palpable  Not palpable   PT  Palpable  Palpable   DP  FaintlyPalpable  Faintly Palpable    Musculoskeletal: M/S 5/5 throughout but R>L, Extremities without ischemic changes   Neurologic: CN 2-12 intact , Pain and light touch intact in extremities , Motor exam as listed above  Assessment: Greater than 90% stenosis right internal Johnson artery. I reviewed her angiogram images and her arch and the lesion do seem amenable to Johnson stenting. The patient does have a high Johnson bifurcation and the lesion extends well above the angle of the mandible.  Plan: Risks benefits possible palpitations and procedure details were discussed with the patient and her  daughter today. These include but are not limited to bleeding infection stroke risk of contrast reaction. The patient will continue her Plavix and aspirin. She is scheduled for right Johnson stenting on 05/11/2015.  Ruta Hinds, MD [facility] Office: [phone] Pager: [phone]

## 2015-05-11 NOTE — Progress Notes (Signed)
Neuro assessment-alert and oriented X 3. Speech clear and appropriate. Smile symmetrical; tongue midline. Upper and lower extremities equal bilaterally in strength. Both pupils reactive to light, rt pupil slightly larger than lt--Dr. Oneida Alar made aware.

## 2015-05-11 NOTE — Progress Notes (Signed)
Son in to visit. Neuro assessment unchanged.

## 2015-05-11 NOTE — Progress Notes (Signed)
[name], MD  P [facility] Charge Pool           Right carotid stent   Pt was on a study drug. This has been stopped. She will be on Plavix for life. No study drug; real plavix only.   [name]

## 2015-05-11 NOTE — Telephone Encounter (Signed)
Spoke to Dr. Oneida Alar this morning, [name] is going to have carotid stenting. Told him about the discussion with Dr. Leonie Man on 05/10/15 that if subject is undergoing stenting  , research study medication can be stopped and patient can start Plavix post op. No need to unblind .  Also informed dr. Leonie Man.

## 2015-05-11 NOTE — Interval H&P Note (Signed)
History and Physical Interval Note:  05/11/2015 7:42 AM  Fontaine No  has presented today for surgery, with the diagnosis of carotid stenosis/right  The various methods of treatment have been discussed with the patient and family. After consideration of risks, benefits and other options for treatment, the patient has consented to  Procedure(s): Carotid PTA/Stent Intervention (N/A) as a surgical intervention .  The patient's history has been reviewed, patient examined, no change in status, stable for surgery.  I have reviewed the patient's chart and labs.  Questions were answered to the patient's satisfaction.     Ruta Hinds

## 2015-05-11 NOTE — Progress Notes (Signed)
Neuro assessment unchanged.

## 2015-05-11 NOTE — Progress Notes (Addendum)
Site area: rt groin Site Prior to Removal:  Level 0 Pressure Applied For:  20 minutes Manual:   yes Patient Status During Pull:  stable Post Pull Site:  Level  0 Post Pull Instructions Given:  yes Post Pull Pulses Present: yes Dressing Applied:  tegaderm Bedrest begins @  1110 Comments: none. Neuro assessment stable.

## 2015-05-11 NOTE — Progress Notes (Signed)
Ate [place] sandwich. Family in to visit. Neuro assessment stable, unchanged.

## 2015-05-12 MED ORDER — CLOPIDOGREL BISULFATE 75 MG PO TABS: 75.0000 mg | ORAL_TABLET | Freq: Every day | ORAL | Status: DC

## 2015-05-12 MED ORDER — OXYCODONE-ACETAMINOPHEN 5-325 MG PO TABS
1.0000 | ORAL_TABLET | Freq: Four times a day (QID) | ORAL | Status: DC | PRN
Start: 2015-05-12 — End: 2016-01-24

## 2015-05-12 NOTE — Progress Notes (Signed)
Pt stated she will be going home and may perform bath before leaving. Tech ask Pt to notify her when ready for bath.

## 2015-05-12 NOTE — Progress Notes (Addendum)
[facility]  Subjective  - Doing well over all.  She has voided.  She needs to walk and eat breakfast before going home.   Objective 111/59 67 97.8 F (36.6 C) (Oral) 16 99%  Intake/Output Summary (Last 24 hours) at 05/12/15 0811 Last data filed at 05/12/15 5374  Gross per 24 hour  Intake 1856.67 ml  Output    450 ml  Net 1406.67 ml    Grip 5/5 bilaterally Palpable radial pulses 3+ bilaterally Right groin soft clean and dry Heart RRR Lungs non labored breathing   Assessment/Planning: POD # 1 Carotid stent treated 99% to 0% residual with Abbott xact 7 x 9 x 30 stent  Disposition stable She will be discharged home with a script for plavix daily F/U in 2 weeks with Dr. Carvel Getting, EMMA [facility] 05/12/2015 8:11 AM --  Laboratory Lab Results:  Recent Labs  05/11/15 0614  HGB 12.6  HCT 37.0   BMET  Recent Labs  05/11/15 0614  NA 145  K 3.2*  CL 109  GLUCOSE 97  BUN 24*  CREATININE 1.20*    COAG Lab Results  Component Value Date   INR 0.99 03/06/2015   INR 0.99 03/13/2011   No results found for: PTT    Agree with the above Should go home today  Annamarie Major

## 2015-05-15 ENCOUNTER — Encounter (HOSPITAL_COMMUNITY): Payer: Self-pay | Admitting: Vascular Surgery

## 2015-05-16 ENCOUNTER — Telehealth: Payer: Self-pay | Admitting: Vascular Surgery

## 2015-05-16 MED FILL — Heparin Sodium (Porcine) 2 Unit/ML in Sodium Chloride 0.9%: INTRAMUSCULAR | Qty: 1000 | Status: AC

## 2015-05-16 NOTE — Telephone Encounter (Addendum)
-----   Message from Denman George, RN sent at 05/11/2015  4:10 PM EDT ----- Regarding: [name] log; also needs 4 wk. (R) carotid duplex and office appt. with [facility]   ----- Message -----    From: Alvia Grove, PA-C    Sent: 05/11/2015  10:00 AM      To: Vvs Charge Pool  S/p right carotid stent 05/11/15  F/u with Dr. Oneida Alar in 4 weeks with duplex.  Thanks Maudie Mercury  notiifed patient of post op appointment on 06-14-15 for a carotid duplex at 1PM and then 2pm with dr. Oneida Alar

## 2015-05-17 NOTE — Discharge Summary (Signed)
[facility] Discharge Summary   Patient ID:  [name] MRN: [id number] DOB/AGE: [birth year] 81 y.o.  Admit date: 05/11/2015 Discharge date: 05/12/2015 Date of Surgery: 05/11/2015 Surgeon: Surgeon(s): [name], MD Lorretta Harp, MD  Admission Diagnosis: carotid stenosis right  Discharge Diagnoses:  carotid stenosis right  Secondary Diagnoses: Past Medical History  Diagnosis Date  . Hypertension   . Cataract   . Parathyroid disease     Clarify with patient. No detail indicated on history form.  . Cancer     breast  . Renal insufficiency     Procedure(s): Carotid PTA/Stent Intervention  Discharged Condition: good  HPI: Patient is an 81 year old female who was previously seen by my partner Dr. Bridgett Larsson several weeks ago. She was seen for asymptomatic right internal carotid artery stenosis. She had a stroke in April 2016 with left-sided weakness. She recently underwent carotid angiogram which again showed a greater than 90% right internal carotid artery stenosis. This was a very high lesion and she was referred to me for possible carotid stenting.  She is able to walk without assistance and her left arm weakness is nearly resolved. She has no further episodes of L sided weakness. She is currently on Plavix and aspirin.  Assessment: Greater than 90% stenosis right internal carotid artery. I reviewed her angiogram images and her arch and the lesion do seem amenable to carotid stenting. The patient does have a high carotid bifurcation and the lesion extends well above the angle of the mandible.  Plan: Risks benefits possible palpitations and procedure details were discussed with the patient and her daughter today. These include but are not limited to bleeding infection stroke risk of contrast reaction. The patient will continue her Plavix and aspirin. She is scheduled for right carotid stenting on 05/11/2015.   Hospital Course:  [name] is a 81 y.o.  female is S/P Right Procedure(s): Carotid PTA/Stent Intervention POD# 1 her stay was uneventful.  She has eaten, ambulated and voided.  Disposition stable.  Discharge home.  Pt was on a study drug. This has been stopped. She will be on Plavix for life. No study drug; real plavix only.   Significant Diagnostic Studies: CBC Lab Results  Component Value Date   WBC 8.1 03/08/2015   HGB 12.6 05/11/2015   HCT 37.0 05/11/2015   MCV 89.4 03/08/2015   PLT 218 03/08/2015    BMET    Component Value Date/Time   NA 145 05/11/2015 0614   K 3.2* 05/11/2015 0614   CL 109 05/11/2015 0614   CO2 24 03/08/2015 0450   GLUCOSE 97 05/11/2015 0614   BUN 24* 05/11/2015 0614   CREATININE 1.20* 05/11/2015 0614   CALCIUM 8.4 03/08/2015 0450   CALCIUM 9.3 03/06/2015 1920   GFRNONAA 45* 03/08/2015 0450   GFRAA 52* 03/08/2015 0450   COAG Lab Results  Component Value Date   INR 0.99 03/06/2015   INR 0.99 03/13/2011     Disposition:  Discharge to :Home Discharge Instructions    Call MD for:  redness, tenderness, or signs of infection (pain, swelling, bleeding, redness, odor or green/yellow discharge around incision site)    Complete by:  As directed      Call MD for:  severe or increased pain, loss or decreased feeling  in affected limb(s)    Complete by:  As directed      Call MD for:  temperature >100.5    Complete by:  As directed  Discharge patient    Complete by:  As directed   Discharge pt to home     Driving Restrictions    Complete by:  As directed   No driving for 1 week     Increase activity slowly    Complete by:  As directed   Walk with assistance use walker or cane as needed     Lifting restrictions    Complete by:  As directed   No lifting for 1 week     Resume previous diet    Complete by:  As directed      may wash over wound with mild soap and water    Complete by:  As directed   May take dressing off right groin on 05/13/15 and shower. Wash wound with soap and  water and pat dry. You do not have to apply a dressing.            Medication List    STOP taking these medications        ranitidine 150 MG/10ML syrup  Commonly known as:  ZANTAC     research study medication      TAKE these medications        aspirin 81 MG EC tablet  Take 1 tablet (81 mg total) by mouth daily.     cholecalciferol 400 UNITS Tabs tablet  Commonly known as:  VITAMIN D  Take 400 Units by mouth daily.     clopidogrel 75 MG tablet  Commonly known as:  PLAVIX  Take 1 tablet (75 mg total) by mouth daily.     clopidogrel 75 MG tablet  Commonly known as:  PLAVIX  Take 1 tablet (75 mg total) by mouth daily.     felodipine 10 MG 24 hr tablet  Commonly known as:  PLENDIL  Take 10 mg by mouth daily.     ferrous sulfate 325 (65 FE) MG tablet  Take 325 mg by mouth daily with breakfast.     ibandronate 150 MG tablet  Commonly known as:  BONIVA  Take 150 mg by mouth every 30 (thirty) days. Pt takes on 6th --Take in the morning with a full glass of water, on an empty stomach, and do not take anything else by mouth or lie down for the next 30 min.     losartan 25 MG tablet  Commonly known as:  COZAAR  Take 25 mg by mouth daily.     metoprolol succinate 50 MG 24 hr tablet  Commonly known as:  TOPROL-XL  Take 50 mg by mouth daily.     oxyCODONE-acetaminophen 5-325 MG per tablet  Commonly known as:  PERCOCET/ROXICET  Take 1 tablet by mouth every 6 (six) hours as needed.     potassium chloride SA 20 MEQ tablet  Commonly known as:  K-DUR,KLOR-CON  Take 20 mEq by mouth 2 (two) times daily.     pravastatin 40 MG tablet  Commonly known as:  PRAVACHOL  Take 40 mg by mouth daily.       Verbal and written Discharge instructions given to the patient. Wound care per Discharge AVS     Follow-up Information    Follow up with Ruta Hinds, MD In 4 weeks.   Specialties:  Vascular Surgery, Cardiology   Why:  Our office will call you to arrange an appointment  (sent)   Contact information:   [place] [address] [phone]       Signed: Laurence Slate [facility] 05/17/2015,  1:12 PM  --- For VQI Registry use --- Instructions: Press F2 to tab through selections.  Delete question if not applicable.   Modified Rankin score at D/C (0-6): Rankin Score=0  IV medication needed for:  1. Hypertension: No 2. Hypotension: No  Post-op Complications: No  1. Post-op CVA or TIA: No  If yes: Event classification (right eye, left eye, right cortical, left cortical, verterobasilar, other):    If yes: Timing of event (intra-op, <6 hrs post-op, >=6 hrs post-op, unknown):   2. CN injury: No  If yes: CN  injuried   3. Myocardial infarction: No  If yes: Dx by (EKG or clinical, Troponin):   4.  CHF: No  5.  Dysrhythmia (new): No  6. Wound infection: No  7. Reperfusion symptoms: No  8. Return to OR: No  If yes: return to OR for (bleeding, neurologic, other CEA incision, other):   Discharge medications: Statin use:  Yes ASA use:  Yes Beta blocker use:  Yes ACE-Inhibitor use:  No  for medical reason   P2Y12 Antagonist use: [ ]  None, [x ] Plavix, [ ]  Plasugrel, [ ]  Ticlopinine, [ ]  Ticagrelor, [ ]  Other, [ ]  No for medical reason, [ ]  Non-compliant, [ ]  Not-indicated Anti-coagulant use:  [ ]  None, [ ]  Warfarin, [ ]  Rivaroxaban, [ ]  Dabigatran, [ ]  Other, [ ]  No for medical reason, [ ]  Non-compliant, [ ]  Not-indicated

## 2015-06-08 ENCOUNTER — Telehealth: Payer: Self-pay

## 2015-06-08 NOTE — Telephone Encounter (Signed)
Called and spoke to the patient she confirmed that she is not taking the POINT trial medicine as she was advised by the doctors after surgery. I also called and provided this info to  Collie Siad at risk Mgmt.

## 2015-06-12 ENCOUNTER — Encounter: Payer: Self-pay | Admitting: Vascular Surgery

## 2015-06-12 ENCOUNTER — Encounter (INDEPENDENT_AMBULATORY_CARE_PROVIDER_SITE_OTHER): Payer: Self-pay | Admitting: Neurology

## 2015-06-12 DIAGNOSIS — Z0289 Encounter for other administrative examinations: Secondary | ICD-10-CM

## 2015-06-14 ENCOUNTER — Encounter: Payer: Self-pay | Admitting: Vascular Surgery

## 2015-06-14 ENCOUNTER — Ambulatory Visit (HOSPITAL_COMMUNITY)
Admission: RE | Admit: 2015-06-14 | Discharge: 2015-06-14 | Disposition: A | Discharge status: Home / Self Care | Payer: Medicare Other | Source: Ambulatory Visit | Attending: Vascular Surgery | Admitting: Vascular Surgery

## 2015-06-14 ENCOUNTER — Ambulatory Visit (INDEPENDENT_AMBULATORY_CARE_PROVIDER_SITE_OTHER): Payer: Medicare Other | Admitting: Vascular Surgery

## 2015-06-14 VITALS — BP 151/71 | HR 67 | Temp 98.2°F | Resp 16 | Ht 63.0 in | Wt 110.8 lb

## 2015-06-14 DIAGNOSIS — Z95828 Presence of other vascular implants and grafts: Secondary | ICD-10-CM | POA: Insufficient documentation

## 2015-06-14 DIAGNOSIS — Z48812 Encounter for surgical aftercare following surgery on the circulatory system: Secondary | ICD-10-CM

## 2015-06-14 DIAGNOSIS — I6521 Occlusion and stenosis of right carotid artery: Secondary | ICD-10-CM | POA: Insufficient documentation

## 2015-06-14 DIAGNOSIS — I639 Cerebral infarction, unspecified: Secondary | ICD-10-CM | POA: Diagnosis not present

## 2015-06-14 NOTE — Progress Notes (Signed)
POST OPERATIVE OFFICE NOTE    CC:  F/u for surgery  HPI:  This is a 81 y.o. female who is s/p right carotid angiogram and right carotid angioplasty and stenting on 05/11/15 by Dr. Oneida Alar.  She returns today for follow up.  She states that she is doing well and the weakness in her left arm continues to improve.  She is still taking her Plavix/Aspirin.  She states that she has lost a few pounds and wants to gain a couple of pounds back.  She states that she cooks her meals at home.  No Known Allergies  Current Outpatient Prescriptions  Medication Sig Dispense Refill  . aspirin EC 81 MG EC tablet Take 1 tablet (81 mg total) by mouth daily. 30 tablet 6  . cholecalciferol (VITAMIN D) 400 UNITS TABS tablet Take 400 Units by mouth daily.    . clopidogrel (PLAVIX) 75 MG tablet Take 1 tablet (75 mg total) by mouth daily. 30 tablet 3  . felodipine (PLENDIL) 10 MG 24 hr tablet Take 10 mg by mouth daily.    . ferrous sulfate 325 (65 FE) MG tablet Take 325 mg by mouth daily with breakfast.    . ibandronate (BONIVA) 150 MG tablet Take 150 mg by mouth every 30 (thirty) days. Pt takes on 6th --Take in the morning with a full glass of water, on an empty stomach, and do not take anything else by mouth or lie down for the next 30 min.    Marland Kitchen losartan (COZAAR) 25 MG tablet Take 25 mg by mouth daily.    . metoprolol succinate (TOPROL-XL) 50 MG 24 hr tablet Take 50 mg by mouth daily.     . potassium chloride SA (K-DUR,KLOR-CON) 20 MEQ tablet Take 20 mEq by mouth 2 (two) times daily.    . pravastatin (PRAVACHOL) 40 MG tablet Take 40 mg by mouth daily.    . clopidogrel (PLAVIX) 75 MG tablet Take 1 tablet (75 mg total) by mouth daily. 30 tablet 3  . oxyCODONE-acetaminophen (PERCOCET/ROXICET) 5-325 MG per tablet Take 1 tablet by mouth every 6 (six) hours as needed. (Patient not taking: Reported on 06/14/2015) 15 tablet 0   No current facility-administered medications for this visit.     ROS:  See HPI  Physical  Exam:  Filed Vitals:   06/14/15 1246  BP: 151/71  Pulse:   Temp:   Resp:     Incision:  Right groin is soft without hematoma Neuro: mild right arm weakness otherwise, in tact.   Assessment/Plan:  This is a 81 y.o. female who is s/p: Right carotid angioplasty and stenting  -pt doing well today -she continues to have some left arm weakness, but this is improving -f/u in 6 months with carotid duplex -continue Plavix/Aspirin lifelong unless there is a contraindication for these. -okay to drink ensure 2x/day to gain a couple of pounds back.   Leontine Locket, PA-C [facility] [phone]  Clinic MD:  Pt seen and examined with Dr. Oneida Alar   History and exam details as above. Patient with recent right carotid stent for treatment of symptomatic right internal carotid artery stenosis. The patient's preoperative neuro exam was primarily of left hand weakness from her preoperative stroke. This continues to improve. She will follow-up with [place] in 6 months time. She will continue her Plavix and aspirin indefinitely.  Ruta Hinds, MD [facility] [place] Office: [phone] Pager: [phone]   --- For [place] use --- Instructions: Press F2  to tab through selections.  Delete question if not applicable.   Modified Rankin score at D/C (0-6): Rankin Score=1  Statin use:  Yes ASA use:  Yes Beta blocker use:  Yes ACE-Inhibitor use:   ARB use:  yes P2Y12 Antagonist use: [ ]  None, [ x] Plavix, [ ]  Plasugrel, [ ]  Ticlopinine, [ ]  Ticagrelor, [ ]  Other, [ ]  No for medical reason, [ ]  Non-compliant, [ ]  Not-indicated Anti-coagulant use:  [x ] None, [ ]  Warfarin, [ ]  Rivaroxaban, [ ]  Dabigatran, [ x] Other, [ ]  No for medical reason, [ ]  Non-compliant, [ ]  Not-indicated  VASCULAR QUALITY INITIATIVE FOLLOW UP DATA:  Current smoker: [  ] yes  [  x] no  Living status: [x  ]  Home  [  ] Nursing home  [  ] Homeless    MEDS:  ASA [x  ] yes   [  ] no- [  ] medical reason  [  ] non compliant  STATIN  [ x ] yes  [  ] no- [  ] medical reason  [  ] non compliant  Beta blocker [x  ] yes  [  ] no- [  ] medical reason  [  ] non compliant  ACE inhibitor [  ] yes  [  ] no- [ x ] medical reason  [  ] non compliant  ARB use: yes  P2Y12 Antagonist [ x ] none  [  ] clopidogrel-Plavix  [  ] ticlopidine-Ticlid   [  ] prasugrel-Effient  [  ] ticagrelor- Brilinta    Anticoagulant [x  ] None  [  ] warfarin  [  ] rivaroxaban-Xarelto [  ] dabigatran- Pradaxa  Neurologic event since D/C:  [  ] no  [  ] yes: [  ] eye event  [  ] cortical event  [  ] VB event  [  ] non specific event  [  ] right  [  ] left  [  ] TIA  [  ] stroke  Date:   Modified Rankin Score: 1  MI since D/C: [ x ] no  [  ] troponin only  [  ] EKG or clinical  Cranial nerve injury: [ x ] none  [  ] resolved  [  ] persistent  Duplex stent site: [  ] no  [  ] yes - PSV= 101  EDV= 28  ICA/CCA ratio: n/a  Stenosis= [x  ] <40% [  ] 40-59% [  ] 60-79%  [  ] > 80%  [  ]  [place]

## 2015-06-14 NOTE — Progress Notes (Signed)
Filed Vitals:   06/14/15 1239 06/14/15 1240 06/14/15 1246  BP: 154/61 159/69 151/71  Pulse: 67    Temp: 98.2 F (36.8 C)    TempSrc: Oral    Resp: 16    Height: 5\' 3"  (1.6 m)    Weight: 110 lb 12.8 oz (50.259 kg)

## 2015-06-18 NOTE — Addendum Note (Signed)
Addended by: Dorthula Rue L on: 06/18/2015 09:34 AM   Modules accepted: Orders

## 2015-06-20 ENCOUNTER — Other Ambulatory Visit: Payer: Self-pay | Admitting: Vascular Surgery

## 2015-06-28 ENCOUNTER — Encounter: Payer: Self-pay | Admitting: Gastroenterology

## 2015-07-06 ENCOUNTER — Ambulatory Visit: Payer: Medicare Other | Admitting: Neurology

## 2015-10-25 ENCOUNTER — Encounter: Payer: Self-pay | Admitting: Neurology

## 2015-10-25 ENCOUNTER — Ambulatory Visit (INDEPENDENT_AMBULATORY_CARE_PROVIDER_SITE_OTHER): Payer: Medicare Other | Admitting: Neurology

## 2015-10-25 VITALS — BP 138/61 | HR 73 | Ht 63.0 in | Wt 112.2 lb

## 2015-10-25 DIAGNOSIS — Z9889 Other specified postprocedural states: Secondary | ICD-10-CM | POA: Diagnosis not present

## 2015-10-25 DIAGNOSIS — Z959 Presence of cardiac and vascular implant and graft, unspecified: Secondary | ICD-10-CM | POA: Insufficient documentation

## 2015-10-25 DIAGNOSIS — I6521 Occlusion and stenosis of right carotid artery: Secondary | ICD-10-CM | POA: Diagnosis not present

## 2015-10-25 DIAGNOSIS — D329 Benign neoplasm of meninges, unspecified: Secondary | ICD-10-CM | POA: Diagnosis not present

## 2015-10-25 DIAGNOSIS — I63511 Cerebral infarction due to unspecified occlusion or stenosis of right middle cerebral artery: Secondary | ICD-10-CM | POA: Diagnosis not present

## 2015-10-25 DIAGNOSIS — I639 Cerebral infarction, unspecified: Secondary | ICD-10-CM

## 2015-10-25 NOTE — Progress Notes (Signed)
STROKE NEUROLOGY FOLLOW UP NOTE  NAME: [name] DOB: Sep 12, 1931  REASON FOR VISIT: stroke follow up HISTORY FROM: pt and chart  Today we had the pleasure of seeing [name] in follow-up at our Neurology Clinic. Pt was accompanied by no one.   History Summary [name] is a 81 y.o. female with history of HTN, HLD, CKD stage 2-3, primary parathyroidism (status post parathyroid resection in 2010), history of breast cancer in 1990s and tobacco abuse was admitted on 03/06/15 for Left arm weakness and decreased sensation. MRI showed right MCA scattered infarcts, embolic pattern. CUS showed right ICA >80% stenosis. CTA head and neck done showed right ICA 90% stenosis. 2D ehco negative and LD 56 and A1C 6.0. She was also enrolled into POINT trial at that time. Vascular surgery consulted and planned for intervention as outpt. Her MRI also showed right tempoparietal meningioma which recommend to follow up in 6 months due to hx of breast cancer.  Interval History During the interval time, the patient has been doing well. LUE weakness much improved. She had follow up with vascular surgery and had right CAS on 7/1/126. Tolerating well. Repeat CUS in 06/2015 wide patent right ICA. she is continued on dual antiplatelet and dropped off from POINT trial. Today BP 138/61 and she has no complains. She would like her weight to be better and encouraged to use ensure. No HA or N/V.          REVIEW OF SYSTEMS: Full 14 system review of systems performed and notable only for those listed below and in HPI above, all others are negative:  Constitutional:   Cardiovascular:  Ear/Nose/Throat:   Skin:  Eyes:   Respiratory:   Gastroitestinal:   Genitourinary:  Hematology/Lymphatic:   Endocrine:  Musculoskeletal:   Allergy/Immunology:   Neurological:   Psychiatric:  Sleep:   The following represents the patient's updated allergies and side effects list: No Known Allergies  The neurologically  relevant items on the patient's problem list were reviewed on today's visit.  Neurologic Examination  A problem focused neurological exam (12 or more points of the single system neurologic examination, vital signs counts as 1 point, cranial nerves count for 8 points) was performed.  Blood pressure 138/61, pulse 73, height 5\' 3"  (1.6 m), weight 112 lb 3.2 oz (50.894 kg).  General - Well nourished, well developed, in no apparent distress.  Ophthalmologic - Fundi not visualized due to eye movement.  Cardiovascular - Regular rate and rhythm.  Mental Status -  Level of arousal and orientation to time, place, and person were intact. Language including expression, naming, repetition, comprehension was assessed and found intact. Fund of Knowledge was assessed and was intact.  Cranial Nerves II - XII - II - Visual field intact OU. III, IV, VI - Extraocular movements intact. V - Facial sensation intact bilaterally. VII - Facial movement intact bilaterally. VIII - Hearing & vestibular intact bilaterally. X - Palate elevates symmetrically. XI - Chin turning & shoulder shrug intact bilaterally. XII - Tongue protrusion intact.  Motor Strength - The patient's strength was normal in all extremities except left bicep 4+/5 and left mild dexterity difficulty and pronator drift was absent.  Bulk was normal and fasciculations were absent.   Motor Tone - Muscle tone was assessed at the neck and appendages and was normal.  Reflexes - The patient's reflexes were 1+ in all extremities and she had no pathological reflexes.  Sensory - Light touch, temperature/pinprick were  assessed and were normal.    Coordination - The patient had normal movements in the hands and feet with no ataxia or dysmetria.  Tremor was absent.  Gait and Station - The patient's transfers, posture, gait, station, and turns were observed as normal.  Data reviewed: I personally reviewed the images and agree with the radiology  interpretations.  Ct Head Wo Contrast 03/06/2015 1. Acute nonhemorrhagic infarcts involving the right postcentral gyrus and right pre frontal gyrus. 2. Atherosclerosis. 3. Aspects score is 8/10   Mr Brain Wo Contrast 03/07/2015  - Patchy acute infarct in the upper right MCA territory with scattered petechial hemorrhage. The volume of infarct is grossly stable from prior head CT.  - 11 mm extra-axial mass along the right cerebral convexity, appearance consistent with meningioma. In this patient with history breast cancer, 1 followup at 6 months is recommended to document stability.   [name] Head/brain Wo Cm 03/07/2015  No treatable intracranial stenosis or branch occlusion. Mild for age intracranial atherosclerosis, best visualized in the cavernous carotids and distal right PCA branches.   Carotid Doppler Right: Greater than 80% internal carotid artery stenosis. Left: 1-39% ICA stenosis. Bilateral: Vertebral artery flow is antegrade.   2D Echocardiogram  - Left ventricle: The cavity size was normal. Wall thickness wasincreased in a pattern of mild LVH. The estimated ejectionfraction was 65%. Wall motion was normal; there were no regionalwall motion abnormalities. - Right ventricle: The cavity size was normal. Systolic functionwas normal. Impressions: No cardiac source of embolism was identified, butcannot be ruled out on the basis of this examination.  CTA head  03/08/2015 1. Evolving acute patchy right MCA territory ischemic infarcts, overall stable in distribution as compared to prior MRI. No evidence for hemorrhagic transformation. Patchy post-contrast enhancement about the areas of infarction likely related to blood brain barrier breakdown and small petechial hemorrhage as seen on previous MRI. 2. No focal hemodynamically significant stenosis identified within the intracranial circulation. 3. Subtle attenuation within the distal right MCA artery branches  as compared to the left, likely related to the acute right MCA territory infarcts. 4. Multi focal calcified atheromatous plaque within the cavernous carotid arteries bilaterally without focal high-grade stenosis. 5. 14 mm homogeneously enhancing mass overlying the right cerebral convexity. Finding most likely reflects a meningioma. However, given the history of breast cancer, a follow-up examination in 6 months is suggested to ensure stability.  CTA of the neck 03/08/2015 1. Calcified and noncalcified atheromatous plaque within the proximal right ICA with associated severe high-grade stenosis of approximately 90% by NASCET criteria. This stenosis begins at the right carotid bifurcation, and measures approximately 14 mm in length. Right ICA is well opacified distally to the skullbase. 2. Calcified atheromatous disease within the proximal left ICA with associated short segment stenosis of approximately 40% by NASCET criteria. This stenosis begins at the left carotid bifurcation, and measures approximately 13 mm in length. Left ICA is well opacified distally to the skullbase. 3. Calcified plaque at the origin of the vertebral arteries without high-grade stenosis. Vertebral arteries are otherwise widely patent to the skullbase.  CUS 06/2015 - wide patent right ICA  Component     Latest Ref Rng 03/06/2015 03/06/2015 03/06/2015 03/07/2015         7:20 PM  7:20 PM  7:20 PM   Cholesterol     0 - 200 mg/dL    155  Triglycerides     <150 mg/dL    220 (H)  HDL Cholesterol     >  39 mg/dL    55  Total CHOL/HDL Ratio         2.8  VLDL     0 - 40 mg/dL    44 (H)  LDL (calc)     0 - 99 mg/dL    56  PTH       NOPLAS Comment   Calcium, Total (PTH)     8.7 - 10.3 mg/dL  9.3    Hemoglobin A1C     4.8 - 5.6 %    6.0 (H)  Mean Plasma Glucose         126  TSH     0.350 - 4.500 uIU/mL 1.290       Assessment: As you may recall, she is a 81 y.o. African American female with PMH of HTN, HLD,  CKD stage 2-3, primary parathyroidism (status post parathyroid resection in 2010), history of breast cancer in 1990s and tobacco abuse was admitted on 03/06/15 for Left arm weakness and decreased sensation. MRI showed right MCA scattered infarcts, embolic pattern. CUS showed right ICA >80% stenosis. CTA head and neck done showed right ICA 90% stenosis. 2D ehco negative and LD 56 and A1C 6.0. She was also enrolled into POINT trial at that time. Vascular surgery consulted and eventually did right CAS on 05/11/15. Tolerating well. Repeat CUS in 06/2015 wide patent right ICA. she is continued on dual antiplatelet and dropped off from POINT trial. Her MRI also showed right tempoparietal meningioma which recommend to follow up in 6 months due to hx of breast cancer.  Plan:  - continue ASA and plavix and pravastatin for stroke prevention and after carotid stenting - check BP at home - Follow up with your primary care physician for stroke risk factor modification. Recommend maintain blood pressure goal <130/80, diabetes with hemoglobin A1c goal below 6.5% and lipids with LDL cholesterol goal below 70 mg/dL.  - repeat MRI with and without contrast to follow up meningioma - healthy diet and regular exercise, ensure as needed - follow up in 6 months.  I spent more than 25 minutes of face to face time with the patient. Greater than 50% of time was spent in counseling and coordination of care.    Orders Placed This Encounter  Procedures  . MR Brain W Wo Contrast    Standing Status: Future     Number of Occurrences:      Standing Expiration Date: 12/26/2016    Order Specific Question:  Reason for Exam (SYMPTOM  OR DIAGNOSIS REQUIRED)    Answer:  follow up meningioma    Order Specific Question:  Preferred imaging location?    Answer:  Internal    Order Specific Question:  Does the patient have a pacemaker or implanted devices?    Answer:  No    Order Specific Question:  What is the patient's sedation  requirement?    Answer:  No Sedation    No orders of the defined types were placed in this encounter.    Patient Instructions  - continue ASA and plavix and pravastatin for stroke prevention and after carotid stenting - check BP at home - Follow up with your primary care physician for stroke risk factor modification. Recommend maintain blood pressure goal <130/80, diabetes with hemoglobin A1c goal below 6.5% and lipids with LDL cholesterol goal below 70 mg/dL.  - will repeat MRI with and without contrast to follow up the benign tumor in the brain - healthy diet and regular exercise - follow  up in 6 months.    Rosalin Hawking, MD PhD [facility] [address] [place], [place] [address] [phone]

## 2015-10-25 NOTE — Patient Instructions (Signed)
-   continue ASA and plavix and pravastatin for stroke prevention and after carotid stenting - check BP at home - Follow up with your primary care physician for stroke risk factor modification. Recommend maintain blood pressure goal <130/80, diabetes with hemoglobin A1c goal below 6.5% and lipids with LDL cholesterol goal below 70 mg/dL.  - will repeat MRI with and without contrast to follow up the benign tumor in the brain - healthy diet and regular exercise - follow up in 6 months.

## 2015-11-27 ENCOUNTER — Encounter (HOSPITAL_COMMUNITY): Payer: Self-pay

## 2015-11-27 ENCOUNTER — Emergency Department (HOSPITAL_COMMUNITY)
Admission: EM | Admit: 2015-11-27 | Discharge: 2015-11-27 | Discharge status: Against Medical Advice | Payer: Medicare Other | Attending: Emergency Medicine | Admitting: Emergency Medicine

## 2015-11-27 DIAGNOSIS — I10 Essential (primary) hypertension: Secondary | ICD-10-CM | POA: Diagnosis not present

## 2015-11-27 DIAGNOSIS — K5900 Constipation, unspecified: Secondary | ICD-10-CM | POA: Diagnosis not present

## 2015-11-27 NOTE — ED Notes (Signed)
Pt brought in to triage via EMS and placed in waiting room. Pt called EMS with c/o subjective fever, chills and constipation. She reports she ate some scrambled eggs after leaving them out on the stove all day. She reports that she just had a bowel upon arrival to ED and states she feels "much better" and was requesting to leave just as this RN was calling her name for triage.

## 2015-11-27 NOTE — ED Notes (Signed)
Pt called for room, no answer x2.

## 2015-11-28 ENCOUNTER — Ambulatory Visit
Admission: RE | Admit: 2015-11-28 | Discharge: 2015-11-28 | Disposition: A | Discharge status: Home / Self Care | Payer: Medicare Other | Source: Ambulatory Visit | Attending: Neurology | Admitting: Neurology

## 2015-11-28 DIAGNOSIS — D329 Benign neoplasm of meninges, unspecified: Secondary | ICD-10-CM

## 2015-11-28 MED ORDER — GADOBENATE DIMEGLUMINE 529 MG/ML IV SOLN
5.0000 mL | Freq: Once | INTRAVENOUS | Status: AC | PRN
  Administered 2015-11-28: 5 mL via INTRAVENOUS

## 2015-12-03 ENCOUNTER — Telehealth: Payer: Self-pay

## 2015-12-03 NOTE — Telephone Encounter (Signed)
Rn called patient to notified her that the MRI showed stable size of meningioma and no enlargement. Rn stated to continue current treatment plan. Patient verbalized understanding and will follow up in one year per Dr. Erlinda Hong note.

## 2015-12-03 NOTE — Telephone Encounter (Signed)
-----   Message from Rosalin Hawking, MD sent at 12/02/2015  8:56 PM EST ----- Could you please let the patient know that her recent MRI brain showed stable size of meningioma and no enlargement. We will follow up again in one year. Please continue current treatment. Thanks.   Rosalin Hawking, MD PhD Stroke Neurology 12/02/2015 8:56 PM

## 2015-12-05 ENCOUNTER — Emergency Department (HOSPITAL_COMMUNITY)
Admission: EM | Admit: 2015-12-05 | Discharge: 2015-12-05 | Disposition: A | Discharge status: Home / Self Care | Payer: Medicare Other | Attending: Emergency Medicine | Admitting: Emergency Medicine

## 2015-12-05 ENCOUNTER — Encounter (HOSPITAL_COMMUNITY): Payer: Self-pay | Admitting: *Deleted

## 2015-12-05 ENCOUNTER — Telehealth: Payer: Self-pay | Admitting: Neurology

## 2015-12-05 DIAGNOSIS — D649 Anemia, unspecified: Secondary | ICD-10-CM

## 2015-12-05 DIAGNOSIS — Z8639 Personal history of other endocrine, nutritional and metabolic disease: Secondary | ICD-10-CM | POA: Insufficient documentation

## 2015-12-05 DIAGNOSIS — Z7982 Long term (current) use of aspirin: Secondary | ICD-10-CM | POA: Diagnosis not present

## 2015-12-05 DIAGNOSIS — Z87448 Personal history of other diseases of urinary system: Secondary | ICD-10-CM | POA: Insufficient documentation

## 2015-12-05 DIAGNOSIS — Z79899 Other long term (current) drug therapy: Secondary | ICD-10-CM | POA: Insufficient documentation

## 2015-12-05 DIAGNOSIS — Z853 Personal history of malignant neoplasm of breast: Secondary | ICD-10-CM | POA: Diagnosis not present

## 2015-12-05 DIAGNOSIS — H269 Unspecified cataract: Secondary | ICD-10-CM | POA: Diagnosis not present

## 2015-12-05 DIAGNOSIS — I10 Essential (primary) hypertension: Secondary | ICD-10-CM | POA: Insufficient documentation

## 2015-12-05 DIAGNOSIS — H02401 Unspecified ptosis of right eyelid: Secondary | ICD-10-CM | POA: Diagnosis not present

## 2015-12-05 DIAGNOSIS — Z8673 Personal history of transient ischemic attack (TIA), and cerebral infarction without residual deficits: Secondary | ICD-10-CM | POA: Insufficient documentation

## 2015-12-05 DIAGNOSIS — H578 Other specified disorders of eye and adnexa: Secondary | ICD-10-CM | POA: Diagnosis present

## 2015-12-05 LAB — CBC WITH DIFFERENTIAL/PLATELET
BASOS ABS: 0 10*3/uL (ref 0.0–0.1)
BASOS PCT: 0 %
Eosinophils Absolute: 0.1 10*3/uL (ref 0.0–0.7)
Eosinophils Relative: 1 %
HCT: 23.1 % — ABNORMAL LOW (ref 36.0–46.0)
Hemoglobin: 7.4 g/dL — ABNORMAL LOW (ref 12.0–15.0)
Lymphocytes Relative: 14 %
Lymphs Abs: 1.2 10*3/uL (ref 0.7–4.0)
MCH: 29.5 pg (ref 26.0–34.0)
MCHC: 32 g/dL (ref 30.0–36.0)
MCV: 92 fL (ref 78.0–100.0)
MONO ABS: 0.6 10*3/uL (ref 0.1–1.0)
Monocytes Relative: 7 %
Neutro Abs: 6.5 10*3/uL (ref 1.7–7.7)
Neutrophils Relative %: 78 %
Platelets: 349 10*3/uL (ref 150–400)
RBC: 2.51 MIL/uL — ABNORMAL LOW (ref 3.87–5.11)
RDW: 15.7 % — AB (ref 11.5–15.5)
WBC: 8.4 10*3/uL (ref 4.0–10.5)

## 2015-12-05 LAB — BASIC METABOLIC PANEL
ANION GAP: 11 (ref 5–15)
BUN: 15 mg/dL (ref 6–20)
CALCIUM: 8.6 mg/dL — AB (ref 8.9–10.3)
CO2: 23 mmol/L (ref 22–32)
Chloride: 107 mmol/L (ref 101–111)
Creatinine, Ser: 1.29 mg/dL — ABNORMAL HIGH (ref 0.44–1.00)
GFR, EST AFRICAN AMERICAN: 43 mL/min — AB (ref >60)
GFR, EST NON AFRICAN AMERICAN: 37 mL/min — AB (ref >60)
GLUCOSE: 139 mg/dL — AB (ref 65–99)
Potassium: 3.8 mmol/L (ref 3.5–5.1)
Sodium: 141 mmol/L (ref 135–145)

## 2015-12-05 NOTE — Telephone Encounter (Signed)
Rn receive call from [place] at [place] center at [facility]. PT was seen December 2016 for stroke follow up. Janett Billow stated patient having weakness right side, slight facial droop, and ptosis. Rn advised that patient needs to seek the nearest ED for stroke work up.

## 2015-12-05 NOTE — ED Provider Notes (Signed)
CSN: [id number]     Arrival date & time 12/05/15  1444 History   First MD Initiated Contact with Patient 12/05/15 1749     Chief Complaint  Patient presents with  . Eye Problem    HPI Pt was brought into the ED because of concerns about her eye drooping.   Pt thought that the white part of her eye was as bright as usual.  She has been seeing an eye doctor.  She was told she had a blood clot and was given a shot.  The last time she received that treatment was about two weeks ago.  She was fine after that treatment though and had not noticed these symptoms until today.  No slurred speech, no trouble with balance or weakness.  No numbness.  No headache.  Family was concerned she might have had a stroke so she came to the ED. Past Medical History  Diagnosis Date  . Hypertension   . Cataract   . Parathyroid disease ([place])     Clarify with patient. No detail indicated on history form.  . Cancer ([place])     breast  . Renal insufficiency   . Stroke [facility])    Past Surgical History  Procedure Laterality Date  . Parathyroidectomy  June 2010  . Breast lumpectomy  2008    Confirm type with patient. Not indicated on medical history form dated 12/26/10.  . Peripheral vascular catheterization Right 04/02/2015    Procedure: Carotid Angiography;  Surgeon: Conrad Lincolnia, MD;  Location: [facility];  Service: Cardiovascular;  Laterality: Right;  . Peripheral vascular catheterization N/A 05/11/2015    Procedure: Carotid PTA/Stent Intervention;  Surgeon: [name], MD;  Location: [facility];  Service: Cardiovascular;  Laterality: N/A;  . Carotid artery angioplasty Right     05/11/15  . Carotid stent Right 05/11/15   Family History  Problem Relation Age of Onset  . Stroke Mother   . Hypertension Mother   . Stroke Father    Social History  Substance Use Topics  . Smoking status: Passive Smoke Exposure - Never Smoker  . Smokeless tobacco: Never Used     Comment: "When using the bathroom"   . Alcohol Use: 0.6 oz/week    1 Glasses of wine per week     Comment: Glass of wine on holidays.   OB History    No data available     Review of Systems  All other systems reviewed and are negative.     Allergies  Review of patient's allergies indicates no known allergies.  Home Medications   Prior to Admission medications   Medication Sig Start Date End Date Taking? Authorizing Provider  aspirin EC 81 MG EC tablet Take 1 tablet (81 mg total) by mouth daily. 03/09/15  Yes Ripudeep Krystal Eaton, MD  clopidogrel (PLAVIX) 75 MG tablet Take 1 tablet (75 mg total) by mouth daily. 05/12/15  Yes Ulyses Amor, PA-C  felodipine (PLENDIL) 10 MG 24 hr tablet Take 10 mg by mouth daily.   Yes Historical Provider, MD  ferrous sulfate 325 (65 FE) MG tablet Take 325 mg by mouth daily with breakfast.   Yes Historical Provider, MD  ibandronate (BONIVA) 150 MG tablet Take 150 mg by mouth every 30 (thirty) days. Pt takes on 6th --Take in the morning with a full glass of water, on an empty stomach, and do not take anything else by mouth or lie down for the next 30 min.  Yes Historical Provider, MD  losartan (COZAAR) 25 MG tablet Take 25 mg by mouth daily.   Yes Historical Provider, MD  metoprolol succinate (TOPROL-XL) 50 MG 24 hr tablet Take 50 mg by mouth daily.  02/26/15  Yes Historical Provider, MD  potassium chloride SA (K-DUR,KLOR-CON) 20 MEQ tablet Take 20 mEq by mouth 2 (two) times daily.   Yes Historical Provider, MD  pravastatin (PRAVACHOL) 40 MG tablet Take 40 mg by mouth every evening.    Yes Historical Provider, MD  clopidogrel (PLAVIX) 75 MG tablet TAKE 1 TABLET BY MOUTH DAILY Patient not taking: Reported on 12/05/2015 06/20/15   [name], MD  oxyCODONE-acetaminophen (PERCOCET/ROXICET) 5-325 MG per tablet Take 1 tablet by mouth every 6 (six) hours as needed. Patient not taking: Reported on 12/05/2015 05/12/15   Ulyses Amor, PA-C   BP 155/70 mmHg  Pulse 83  Temp(Src) 98.2 F (36.8 C)   Resp 12  Ht 5\' 3"  (1.6 m)  Wt 50.378 kg  BMI 19.68 kg/m2  SpO2 100% Physical Exam  Constitutional: She is oriented to person, place, and time. She appears well-developed and well-nourished. No distress.  HENT:  Head: Normocephalic and atraumatic.  Right Ear: External ear normal.  Left Ear: External ear normal.  Mouth/Throat: Oropharynx is clear and moist.  Eyes: Conjunctivae are normal. Right eye exhibits no discharge. Left eye exhibits no discharge. No scleral icterus.  Neck: Neck supple. No tracheal deviation present.  Cardiovascular: Normal rate, regular rhythm and intact distal pulses.   Pulmonary/Chest: Effort normal and breath sounds normal. No stridor. No respiratory distress. She has no wheezes. She has no rales.  Abdominal: Soft. Bowel sounds are normal. She exhibits no distension. There is no tenderness. There is no rebound and no guarding.  Musculoskeletal: She exhibits no edema or tenderness.  Neurological: She is alert and oriented to person, place, and time. She has normal strength. No cranial nerve deficit (No facial droop, extraocular movements intact, tongue midline ) or sensory deficit. She exhibits normal muscle tone. She displays no seizure activity. Coordination normal.  No pronator drift bilateral upper extrem, able to hold both legs off bed for 5 seconds, sensation intact in all extremities, no visual field cuts, no left or right sided neglect, normal finger-nose exam bilaterally, no nystagmus noted   Skin: Skin is warm and dry. No rash noted.  Psychiatric: She has a normal mood and affect.  Nursing note and vitals reviewed.   ED Course  Procedures (including critical care time) Labs Review Labs Reviewed  CBC WITH DIFFERENTIAL/PLATELET - Abnormal; Notable for the following:    RBC 2.51 (*)    Hemoglobin 7.4 (*)    HCT 23.1 (*)    RDW 15.7 (*)    All other components within normal limits  BASIC METABOLIC PANEL - Abnormal; Notable for the following:     Glucose, Bld 139 (*)    Creatinine, Ser 1.29 (*)    Calcium 8.6 (*)    GFR calc non Af Amer 37 (*)    GFR calc Af Amer 43 (*)    All other components within normal limits     MDM   Final diagnoses:  Anemia, unspecified anemia type  Ptosis, right    Patient has an anemia with a hemoglobin of 7.4. She denies any complaints of rectal bleeding. Patient states she was told by her primary care doctor that she was very anemic and she needed to increase her iron. Think this is coincidental and not  related to her symptoms today.  Encouraged close follow up with PCP  She does have a ptosis on exam. I do not see any other evidence of neurologic deficits. Pupils are normal. No horner syndrom. She has history of prior stroke.  Patient is followed by neurology and have him have an MRI of the brain 1 week ago. She also had CT angio without evidence of aneurysm April 2016.  MRI showed a stable meningioma, and an old stroke but no acute changes.    I discussed the case with Dr Janann Colonel.  Doubt acute neurologic event.  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  Recc outpatient ophtho evel.    Dorie Rank, MD 12/05/15 [phone]

## 2015-12-05 NOTE — ED Notes (Signed)
Pt up to restroom.  Gait steady and even.   

## 2015-12-05 NOTE — ED Notes (Signed)
Pt and daughter stating they would like to leave and wondering if they can get neuro results at home.  This RN spoke to MD and pt/family updated.

## 2015-12-05 NOTE — ED Notes (Signed)
MD at bedside updating patient.

## 2015-12-05 NOTE — ED Notes (Signed)
The pt reports that she has rt eye not looking right   Since her daughter noticed today.  The daughter last saw her 2 weeks ago and she is saying that her eye looks different today.  She saw her eye doctor and he suggested she follow up with a neurologist.  Hx stoke.  No facial droop no arm or leg drift

## 2015-12-05 NOTE — ED Notes (Signed)
The pt was discussed with dr pickering and we are not doing a c-t of the head at present

## 2015-12-05 NOTE — ED Notes (Signed)
She had a shot in her rt eye 2-3 weeks  By the eye doctor  She does not know what the shot was

## 2015-12-05 NOTE — ED Notes (Signed)
Patient able to dress and ambulate independently 

## 2015-12-05 NOTE — Telephone Encounter (Signed)
[place] called to advise, patient walked into their office, they saw patient, patient has ptosis, weakness right side, slight facial droop. Dr. Edilia Bo wants our office to see patient today. Patient told their office these symptoms just started happening today.

## 2015-12-14 ENCOUNTER — Encounter: Payer: Self-pay | Admitting: Family

## 2015-12-20 ENCOUNTER — Encounter (HOSPITAL_COMMUNITY): Payer: Medicare Other

## 2015-12-20 ENCOUNTER — Ambulatory Visit: Payer: Medicare Other | Admitting: Family

## 2016-01-21 ENCOUNTER — Encounter: Payer: Self-pay | Admitting: Family

## 2016-01-24 ENCOUNTER — Ambulatory Visit (INDEPENDENT_AMBULATORY_CARE_PROVIDER_SITE_OTHER): Payer: Medicare Other | Admitting: Family

## 2016-01-24 ENCOUNTER — Encounter: Payer: Self-pay | Admitting: Family

## 2016-01-24 ENCOUNTER — Ambulatory Visit (HOSPITAL_COMMUNITY)
Admission: RE | Admit: 2016-01-24 | Discharge: 2016-01-24 | Disposition: A | Discharge status: Home / Self Care | Payer: Medicare Other | Source: Ambulatory Visit | Attending: Family | Admitting: Family

## 2016-01-24 VITALS — BP 138/65 | HR 73 | Temp 97.1°F | Resp 16 | Ht 63.0 in | Wt 108.0 lb

## 2016-01-24 DIAGNOSIS — I6522 Occlusion and stenosis of left carotid artery: Secondary | ICD-10-CM | POA: Insufficient documentation

## 2016-01-24 DIAGNOSIS — Z48812 Encounter for surgical aftercare following surgery on the circulatory system: Secondary | ICD-10-CM | POA: Diagnosis not present

## 2016-01-24 DIAGNOSIS — Z959 Presence of cardiac and vascular implant and graft, unspecified: Secondary | ICD-10-CM | POA: Diagnosis not present

## 2016-01-24 DIAGNOSIS — I6521 Occlusion and stenosis of right carotid artery: Secondary | ICD-10-CM | POA: Diagnosis not present

## 2016-01-24 DIAGNOSIS — I6523 Occlusion and stenosis of bilateral carotid arteries: Secondary | ICD-10-CM | POA: Diagnosis not present

## 2016-01-24 DIAGNOSIS — I10 Essential (primary) hypertension: Secondary | ICD-10-CM | POA: Diagnosis not present

## 2016-01-24 NOTE — Patient Instructions (Signed)
Stroke Prevention Some medical conditions and behaviors are associated with an increased chance of having a stroke. You may prevent a stroke by making healthy choices and managing medical conditions. HOW CAN I REDUCE MY RISK OF HAVING A STROKE?   Stay physically active. Get at least 30 minutes of activity on most or all days.  Do not smoke. It may also be helpful to avoid exposure to secondhand smoke.  Limit alcohol use. Moderate alcohol use is considered to be:  No more than 2 drinks per day for men.  No more than 1 drink per day for nonpregnant women.  Eat healthy foods. This involves:  Eating 5 or more servings of fruits and vegetables a day.  Making dietary changes that address high blood pressure (hypertension), high cholesterol, diabetes, or obesity.  Manage your cholesterol levels.  Making food choices that are high in fiber and low in saturated fat, trans fat, and cholesterol may control cholesterol levels.  Take any prescribed medicines to control cholesterol as directed by your health care provider.  Manage your diabetes.  Controlling your carbohydrate and sugar intake is recommended to manage diabetes.  Take any prescribed medicines to control diabetes as directed by your health care provider.  Control your hypertension.  Making food choices that are low in salt (sodium), saturated fat, trans fat, and cholesterol is recommended to manage hypertension.  Ask your health care provider if you need treatment to lower your blood pressure. Take any prescribed medicines to control hypertension as directed by your health care provider.  If you are 18-39 years of age, have your blood pressure checked every 3-5 years. If you are 40 years of age or older, have your blood pressure checked every year.  Maintain a healthy weight.  Reducing calorie intake and making food choices that are low in sodium, saturated fat, trans fat, and cholesterol are recommended to manage  weight.  Stop drug abuse.  Avoid taking birth control pills.  Talk to your health care provider about the risks of taking birth control pills if you are over 35 years old, smoke, get migraines, or have ever had a blood clot.  Get evaluated for sleep disorders (sleep apnea).  Talk to your health care provider about getting a sleep evaluation if you snore a lot or have excessive sleepiness.  Take medicines only as directed by your health care provider.  For some people, aspirin or blood thinners (anticoagulants) are helpful in reducing the risk of forming abnormal blood clots that can lead to stroke. If you have the irregular heart rhythm of atrial fibrillation, you should be on a blood thinner unless there is a good reason you cannot take them.  Understand all your medicine instructions.  Make sure that other conditions (such as anemia or atherosclerosis) are addressed. SEEK IMMEDIATE MEDICAL CARE IF:   You have sudden weakness or numbness of the face, arm, or leg, especially on one side of the body.  Your face or eyelid droops to one side.  You have sudden confusion.  You have trouble speaking (aphasia) or understanding.  You have sudden trouble seeing in one or both eyes.  You have sudden trouble walking.  You have dizziness.  You have a loss of balance or coordination.  You have a sudden, severe headache with no known cause.  You have new chest pain or an irregular heartbeat. Any of these symptoms may represent a serious problem that is an emergency. Do not wait to see if the symptoms will   go away. Get medical help at once. Call your local emergency services (911 in [place] Do not drive yourself to the hospital.   This information is not intended to replace advice given to you by your health care provider. Make sure you discuss any questions you have with your health care provider.   Document Released: 12/04/2004 Document Revised: 11/17/2014 Document Reviewed:  04/29/2013 Elsevier Interactive Patient Education 2016 Elsevier Inc.  

## 2016-01-24 NOTE — Progress Notes (Signed)
Chief Complaint: Extracranial Carotid Artery Stenosis   History of Present Illness  [name] is a 82 y.o. female who is s/p right carotid angiogram and right carotid angioplasty and stenting on 05/11/15 by Dr. Oneida Alar. She returns today for follow up. She states that she is doing well and the weakness in her left arm continues to improve. She is still taking her Plavix/Aspirin. She states that she has lost a few pounds and wants to gain a couple of pounds back. She states that she cooks her meals at home.  In April 2016 she had a stroke as manifested by left arm tingling, feeling heavy, denies speech difficulties, denies monocular loss of vision; was evaluated at [facility].  She denies any subsequent stroke or TIA.  She states if she walks a long way her right calf feels tight, relieved by 2-3 minutes rest. She denies non healing wounds.   She had injections in her right eye recently.  She is trying to gain weight.  She denies food fear, denies post prandial abdominal pain.   Pt Diabetic: no Pt smoker: non-smoker. She dipped snuff when she was a teen.  Pt meds include: Statin : yes ASA: yes Other anticoagulants/antiplatelets: Plavix   Past Medical History  Diagnosis Date  . Hypertension   . Cataract   . Parathyroid disease ([place])     Clarify with patient. No detail indicated on history form.  . Cancer ([place])     breast  . Renal insufficiency   . Stroke [facility])     Social History Social History  Substance Use Topics  . Smoking status: Passive Smoke Exposure - Never Smoker  . Smokeless tobacco: Never Used     Comment: "When using the bathroom"  . Alcohol Use: 0.6 oz/week    1 Glasses of wine per week     Comment: Glass of wine on holidays.    Family History Family History  Problem Relation Age of Onset  . Stroke Mother   . Hypertension Mother   . Stroke Father     Surgical History Past Surgical History  Procedure Laterality Date  . Parathyroidectomy  June  2010  . Breast lumpectomy  2008    Confirm type with patient. Not indicated on medical history form dated 12/26/10.  . Peripheral vascular catheterization Right 04/02/2015    Procedure: Carotid Angiography;  Surgeon: Conrad Stansberry [place], MD;  Location: [place] [facility];  Service: Cardiovascular;  Laterality: Right;  . Peripheral vascular catheterization N/A 05/11/2015    Procedure: Carotid PTA/Stent Intervention;  Surgeon: [name], MD;  Location: [place] [facility];  Service: Cardiovascular;  Laterality: N/A;  . Carotid artery angioplasty Right     05/11/15  . Carotid stent Right 05/11/15    No Known Allergies  Current Outpatient Prescriptions  Medication Sig Dispense Refill  . aspirin EC 81 MG EC tablet Take 1 tablet (81 mg total) by mouth daily. 30 tablet 6  . clopidogrel (PLAVIX) 75 MG tablet Take 1 tablet (75 mg total) by mouth daily. 30 tablet 3  . felodipine (PLENDIL) 10 MG 24 hr tablet Take 10 mg by mouth daily.    . ferrous sulfate 325 (65 FE) MG tablet Take 325 mg by mouth daily with breakfast.    . ibandronate (BONIVA) 150 MG tablet Take 150 mg by mouth every 30 (thirty) days. Pt takes on 6th --Take in the morning with a full glass of water, on an empty stomach, and do not take  anything else by mouth or lie down for the next 30 min.    Marland Kitchen losartan (COZAAR) 25 MG tablet Take 25 mg by mouth daily.    . metoprolol succinate (TOPROL-XL) 50 MG 24 hr tablet Take 50 mg by mouth daily.     . potassium chloride SA (K-DUR,KLOR-CON) 20 MEQ tablet Take 20 mEq by mouth 2 (two) times daily.    . pravastatin (PRAVACHOL) 40 MG tablet Take 40 mg by mouth every evening.      No current facility-administered medications for this visit.    Review of Systems : See HPI for pertinent positives and negatives.  Physical Examination  Filed Vitals:   01/24/16 1307 01/24/16 1309  BP: 140/73 138/65  Pulse: 73 73  Temp: 97.1 F (36.2 C)   TempSrc: Oral   Resp: 16   Height: 5\' 3"  (1.6 m)   Weight:  108 lb (48.988 kg)   SpO2: 100%    Body mass index is 19.14 kg/(m^2).  General: WDWN thin female in NAD GAIT: normal Eyes: PERRLA Pulmonary:  Non-labored respirations, CTAB, no rales, rhonchi, or wheezing.  Cardiac: regular rhythm, no detected murmur.  VASCULAR EXAM Carotid Bruits Right Left   Negative Negative    Aorta is mildly palpable. Radial pulses are 2+ palpable and equal.                                                                                                                            LE Pulses Right Left       FEMORAL  2+ palpable  2+ palpable        POPLITEAL  not palpable   not palpable       POSTERIOR TIBIAL  not palpable   not palpable        DORSALIS PEDIS      ANTERIOR TIBIAL 2+ palpable  1+ palpable      Gastrointestinal: soft, nontender, BS WNL, no r/g,  no palpable masses.  Musculoskeletal: no muscle atrophy/wasting. M/S 5/5 throughout, extremities without ischemic changes.  Neurologic: A&O X 3; Appropriate Affect, Speech is normal CN 2-12 intact, pain and light touch intact in extremities, Motor exam as listed above.   Non-Invasive Vascular Imaging CAROTID DUPLEX 01/24/2016   CEREBROVASCULAR DUPLEX EVALUATION     INDICATION: Carotid artery disease; evaluation status post stent placement.    PREVIOUS INTERVENTION(S): Right internal carotid artery stent placed 05/11/2015    DUPLEX EXAM:     RIGHT  LEFT  Peak Systolic Velocities (cm/s) End Diastolic Velocities (cm/s) Plaque LOCATION Peak Systolic Velocities (cm/s) End Diastolic Velocities (cm/s) Plaque  68 8  CCA PROXIMAL 62 10   73 13  CCA MID 70 15   51 14  CCA DISTAL 66 15   105 7  ECA 54 6   Stented   ICA PROXIMAL 69 18 HT  86 21  ICA MID 105 28   81 22  ICA DISTAL 84 23  NA ICA / CCA Ratio (PSV)   Antegrade Vertebral Flow Antegrade   Brachial Systolic Pressure (mmHg)   Triphasic subclavian Brachial Artery Waveforms Triphasic subclavian    Peak Systolic Velocities (cm/s)  End Diastolic Velocities (cm/s) Plaque STENT (Right ICA  ) Peak Systolic Velocities (cm/s) End Diastolic Velocities (cm/s) Plaque  108 25  PROXIMAL     101 26  MID     95 29  DISTAL       Plaque Morphology:  HM = Homogeneous, HT = Heterogeneous, CP = Calcific Plaque, SP = Smooth Plaque, IP = Irregular Plaque    ADDITIONAL FINDINGS: See impression on following page.      IMPRESSION: 1. Widely patent right internal carotid artery stent without evidence of restenosis or hyperplasia.  2. Right vertebral artery is antegrade  3. Left internal carotid artery stenosis with PSV consistent with a 1-39% diameter reduction.    Compared to the previous exam:  No significant changes noted on the right internal carotid when compared to previous exam.       Assessment: [name] is a 82 y.o. female who is s/p right carotid angiogram and right carotid angioplasty and stenting on 05/11/15. She had a stroke in April 2016 with right arm weakness that has mostly resolved; she has had no subsequent stroke or TIA.  Today's carotid duplex suggests a widely patent right internal carotid artery stent without evidence of restenosis or hyperplasia.  Right vertebral artery is antegrade  Left internal carotid artery stenosis with PSV consistent with a 1-39% diameter reduction.  She reports right calf tightness with walking longer distances; however both pedal pulses are palpable, right DP pulse is 2+ palpable.    Plan: Follow-up in 6 months with Carotid Duplex scan. If stable at that time, will extend to yearly surveillance.    I discussed in depth with the patient the nature of atherosclerosis, and emphasized the importance of maximal medical management including strict control of blood pressure, blood glucose, and lipid levels, obtaining regular exercise, and continued cessation of smoking.  The patient is aware that without maximal medical management the underlying atherosclerotic disease process will  progress, limiting the benefit of any interventions. The patient was given information about stroke prevention and what symptoms should prompt the patient to seek immediate medical care. Thank you for allowing [place] to participate in this patient's care.  Clemon Chambers, RN, MSN, FNP-C [facility] Office: [phone]  Clinic Physician: Scot Dock  01/24/2016 1:12 PM

## 2016-01-25 NOTE — Addendum Note (Signed)
Addended by: Thresa Ross C on: 01/25/2016 11:34 AM   Modules accepted: Orders

## 2016-01-31 ENCOUNTER — Encounter: Payer: Self-pay | Admitting: Gastroenterology

## 2016-04-02 ENCOUNTER — Other Ambulatory Visit: Payer: Self-pay

## 2016-04-02 DIAGNOSIS — Z1231 Encounter for screening mammogram for malignant neoplasm of breast: Secondary | ICD-10-CM

## 2016-04-24 ENCOUNTER — Encounter: Payer: Self-pay | Admitting: Neurology

## 2016-04-24 ENCOUNTER — Ambulatory Visit (INDEPENDENT_AMBULATORY_CARE_PROVIDER_SITE_OTHER): Payer: Medicare Other | Admitting: Neurology

## 2016-04-24 VITALS — BP 138/70 | HR 80 | Ht 63.0 in | Wt 107.8 lb

## 2016-04-24 DIAGNOSIS — D329 Benign neoplasm of meninges, unspecified: Secondary | ICD-10-CM

## 2016-04-24 DIAGNOSIS — I6521 Occlusion and stenosis of right carotid artery: Secondary | ICD-10-CM

## 2016-04-24 DIAGNOSIS — I63511 Cerebral infarction due to unspecified occlusion or stenosis of right middle cerebral artery: Secondary | ICD-10-CM | POA: Diagnosis not present

## 2016-04-24 DIAGNOSIS — Z9889 Other specified postprocedural states: Secondary | ICD-10-CM

## 2016-04-24 NOTE — Progress Notes (Signed)
STROKE NEUROLOGY FOLLOW UP NOTE  NAME: [name] DOB: 1931/05/14  REASON FOR VISIT: stroke follow up HISTORY FROM: pt and chart  Today we had the pleasure of seeing [name] in follow-up at our Neurology Clinic. Pt was accompanied by no one.   History Summary [name] is a 82 y.o. female with history of HTN, HLD, CKD stage 2-3, primary parathyroidism (status post parathyroid resection in 2010), history of breast cancer in 1990s and tobacco abuse was admitted on 03/06/15 for Left arm weakness and decreased sensation. MRI showed right MCA scattered infarcts, embolic pattern. CUS showed right ICA >80% stenosis. CTA head and neck done showed right ICA 90% stenosis. 2D ehco negative and LD 56 and A1C 6.0. She was also enrolled into POINT trial at that time. Vascular surgery consulted and planned for intervention as outpt. Her MRI also showed right tempoparietal meningioma which recommend to follow up in 6 months due to hx of breast cancer.  10/25/15 follow up - the patient has been doing well. LUE weakness much improved. She had follow up with vascular surgery and had right CAS on 7/1/126. Tolerating well. Repeat CUS in 06/2015 wide patent right ICA. she is continued on dual antiplatelet and dropped off from POINT trial. Today BP 138/61 and she has no complains. She would like her weight to be better and encouraged to use ensure. No HA or N/V.    Interval History During the interval time, pt has been doing well. She had MRI repeat in 11/2015 showed stable meningioma and recommended to repeat in one year. She also follows with vascular surgery and repeat CUS showed right carotid wide open and left ICA no stenosis. Her BP 138/70, no other complains.   REVIEW OF SYSTEMS: Full 14 system review of systems performed and notable only for those listed below and in HPI above, all others are negative:  Constitutional:   Cardiovascular:  Ear/Nose/Throat:   Skin:  Eyes:   Respiratory:     Gastroitestinal:   Genitourinary:  Hematology/Lymphatic:   Endocrine:  Musculoskeletal:   Allergy/Immunology:   Neurological:   Psychiatric:  Sleep:   The following represents the patient's updated allergies and side effects list: No Known Allergies  The neurologically relevant items on the patient's problem list were reviewed on today's visit.  Neurologic Examination  A problem focused neurological exam (12 or more points of the single system neurologic examination, vital signs counts as 1 point, cranial nerves count for 8 points) was performed.  Blood pressure 138/70, pulse 80, height 5\' 3"  (1.6 m), weight 107 lb 12.8 oz (48.898 kg).  General - Well nourished, well developed, in no apparent distress.  Ophthalmologic - Fundi not visualized due to eye movement.  Cardiovascular - Regular rate and rhythm.  Mental Status -  Level of arousal and orientation to time, place, and person were intact. Language including expression, naming, repetition, comprehension was assessed and found intact. Fund of Knowledge was assessed and was intact.  Cranial Nerves II - XII - II - Visual field intact OU. III, IV, VI - Extraocular movements intact. V - Facial sensation intact bilaterally. VII - Facial movement intact bilaterally. VIII - Hearing & vestibular intact bilaterally. X - Palate elevates symmetrically. XI - Chin turning & shoulder shrug intact bilaterally. XII - Tongue protrusion intact.  Motor Strength - The patient's strength was normal in all extremities except left bicep 4+/5 and left mild dexterity difficulty and pronator drift was absent.  Bulk  was normal and fasciculations were absent.   Motor Tone - Muscle tone was assessed at the neck and appendages and was normal.  Reflexes - The patient's reflexes were 1+ in all extremities and she had no pathological reflexes.  Sensory - Light touch, temperature/pinprick were assessed and were normal.    Coordination - The  patient had normal movements in the hands and feet with no ataxia or dysmetria.  Tremor was absent.  Gait and Station - The patient's transfers, posture, gait, station, and turns were observed as normal.   Data reviewed: I personally reviewed the images and agree with the radiology interpretations.  Ct Head Wo Contrast 03/06/2015 1. Acute nonhemorrhagic infarcts involving the right postcentral gyrus and right pre frontal gyrus. 2. Atherosclerosis. 3. Aspects score is 8/10   Mr Brain Wo Contrast 03/07/2015  - Patchy acute infarct in the upper right MCA territory with scattered petechial hemorrhage. The volume of infarct is grossly stable from prior head CT.  - 11 mm extra-axial mass along the right cerebral convexity, appearance consistent with meningioma. In this patient with history breast cancer, 1 followup at 6 months is recommended to document stability.   [name] Head/brain Wo Cm 03/07/2015  No treatable intracranial stenosis or branch occlusion. Mild for age intracranial atherosclerosis, best visualized in the cavernous carotids and distal right PCA branches.   Carotid Doppler Right: Greater than 80% internal carotid artery stenosis. Left: 1-39% ICA stenosis. Bilateral: Vertebral artery flow is antegrade.   2D Echocardiogram  - Left ventricle: The cavity size was normal. Wall thickness wasincreased in a pattern of mild LVH. The estimated ejectionfraction was 65%. Wall motion was normal; there were no regionalwall motion abnormalities. - Right ventricle: The cavity size was normal. Systolic functionwas normal. Impressions: No cardiac source of embolism was identified, butcannot be ruled out on the basis of this examination.  CTA head  03/08/2015 1. Evolving acute patchy right MCA territory ischemic infarcts, overall stable in distribution as compared to prior MRI. No evidence for hemorrhagic transformation. Patchy post-contrast enhancement about the areas  of infarction likely related to blood brain barrier breakdown and small petechial hemorrhage as seen on previous MRI. 2. No focal hemodynamically significant stenosis identified within the intracranial circulation. 3. Subtle attenuation within the distal right MCA artery branches as compared to the left, likely related to the acute right MCA territory infarcts. 4. Multi focal calcified atheromatous plaque within the cavernous carotid arteries bilaterally without focal high-grade stenosis. 5. 14 mm homogeneously enhancing mass overlying the right cerebral convexity. Finding most likely reflects a meningioma. However, given the history of breast cancer, a follow-up examination in 6 months is suggested to ensure stability.  CTA of the neck 03/08/2015 1. Calcified and noncalcified atheromatous plaque within the proximal right ICA with associated severe high-grade stenosis of approximately 90% by NASCET criteria. This stenosis begins at the right carotid bifurcation, and measures approximately 14 mm in length. Right ICA is well opacified distally to the skullbase. 2. Calcified atheromatous disease within the proximal left ICA with associated short segment stenosis of approximately 40% by NASCET criteria. This stenosis begins at the left carotid bifurcation, and measures approximately 13 mm in length. Left ICA is well opacified distally to the skullbase. 3. Calcified plaque at the origin of the vertebral arteries without high-grade stenosis. Vertebral arteries are otherwise widely patent to the skullbase.  CUS 06/2015 - wide patent right ICA   CUS 01/2016 - a widely patent right internal carotid artery stent without evidence  of restenosis or hyperplasia.  Right vertebral artery is antegrade. Left internal carotid artery stenosis with PSV consistent with a 1-39% diameter reduction  MRI with and without contrast 11/29/15 1. Right parieto-temporal extra-axial, enhancing, dural based mass  measuring 13x9x54mm (APxtransxSI). Appearance is most suggestive of meningioma. No definite change in appearance from MRI on 03/07/15 (when accounting for slight differences in imaging angle and slice selection). Consider 1 year follow up study to ensure stability.  2. Chronic ischemic infarctions in right frontal and parietal regions. 3. Mild chronic small vessel ischemic disease.  4. No acute findings.  Component     Latest Ref Rng 03/06/2015 03/06/2015 03/06/2015 03/07/2015         7:20 PM  7:20 PM  7:20 PM   Cholesterol     0 - 200 mg/dL    155  Triglycerides     <150 mg/dL    220 (H)  HDL Cholesterol     >39 mg/dL    55  Total CHOL/HDL Ratio         2.8  VLDL     0 - 40 mg/dL    44 (H)  LDL (calc)     0 - 99 mg/dL    56  PTH       NOPLAS Comment   Calcium, Total (PTH)     8.7 - 10.3 mg/dL  9.3    Hemoglobin A1C     4.8 - 5.6 %    6.0 (H)  Mean Plasma Glucose         126  TSH     0.350 - 4.500 uIU/mL 1.290       Assessment: As you may recall, she is a 82 y.o. African American female with PMH of HTN, HLD, CKD stage 2-3, primary parathyroidism (status post parathyroid resection in 2010), history of breast cancer in 1990s and tobacco abuse was admitted on 03/06/15 for Left arm weakness and decreased sensation. MRI showed right MCA scattered infarcts, embolic pattern. CUS showed right ICA >80% stenosis. CTA head and neck done showed right ICA 90% stenosis. 2D ehco negative and LD 56 and A1C 6.0. She was also enrolled into POINT trial at that time. Vascular surgery consulted and eventually did right CAS on 05/11/15. Tolerating well. Repeat CUS in 06/2015 and 01/2016 wide patent right ICA. she is continued on dual antiplatelet and dropped off from POINT trial. Her MRI also showed right tempoparietal meningioma which recommend to follow up in 6 months due to hx of breast cancer. Repeat MRI in 11/2015 showed stable meningioma. Since her CAS has been one year now, will d/c ASA.   Plan:  -  discontinue ASA  - continue plavix and pravastatin for stroke prevention  - check BP at home - Follow up with your primary care physician for stroke risk factor modification. Recommend maintain blood pressure goal <130/80, diabetes with hemoglobin A1c goal below 6.5% and lipids with LDL cholesterol goal below 70 mg/dL.  - repeat MRI with and without contrast to follow up the benign tumor at the next visit - healthy diet and regular exercise - follow up in one year and repeat MRI   No orders of the defined types were placed in this encounter.    Meds ordered this encounter  Medications  . Cholecalciferol (VITAMIN D3 PO)    Sig: Take 1,000 Units by mouth.    Patient Instructions  - discontinue ASA  - continue plavix and pravastatin for stroke prevention  - check  BP at home - Follow up with your primary care physician for stroke risk factor modification. Recommend maintain blood pressure goal <130/80, diabetes with hemoglobin A1c goal below 6.5% and lipids with LDL cholesterol goal below 70 mg/dL.  - repeat MRI with and without contrast to follow up the benign tumor at the next visit - healthy diet and regular exercise - follow up in one year and repeat MRI    Rosalin Hawking, MD PhD [facility] [address] [place], [place] [address] [phone]

## 2016-04-24 NOTE — Patient Instructions (Addendum)
-   discontinue ASA  - continue plavix and pravastatin for stroke prevention  - check BP at home - Follow up with your primary care physician for stroke risk factor modification. Recommend maintain blood pressure goal <130/80, diabetes with hemoglobin A1c goal below 6.5% and lipids with LDL cholesterol goal below 70 mg/dL.  - repeat MRI with and without contrast to follow up the benign tumor at the next visit - healthy diet and regular exercise - follow up in one year and repeat MRI

## 2016-05-08 ENCOUNTER — Ambulatory Visit
Admission: RE | Admit: 2016-05-08 | Discharge: 2016-05-08 | Disposition: A | Discharge status: Home / Self Care | Payer: Medicare Other | Source: Ambulatory Visit

## 2016-05-08 DIAGNOSIS — Z1231 Encounter for screening mammogram for malignant neoplasm of breast: Secondary | ICD-10-CM

## 2016-05-26 ENCOUNTER — Other Ambulatory Visit: Payer: Self-pay | Admitting: Vascular Surgery

## 2016-06-04 ENCOUNTER — Other Ambulatory Visit: Payer: Self-pay | Admitting: Hematology and Oncology

## 2016-06-04 ENCOUNTER — Ambulatory Visit (HOSPITAL_COMMUNITY)
Admission: RE | Admit: 2016-06-04 | Discharge: 2016-06-04 | Disposition: A | Discharge status: Home / Self Care | Payer: Medicare Other | Source: Ambulatory Visit | Attending: Hematology and Oncology | Admitting: Hematology and Oncology

## 2016-06-04 ENCOUNTER — Telehealth: Payer: Self-pay | Admitting: Hematology and Oncology

## 2016-06-04 ENCOUNTER — Encounter: Payer: Self-pay | Admitting: Hematology and Oncology

## 2016-06-04 ENCOUNTER — Ambulatory Visit (HOSPITAL_BASED_OUTPATIENT_CLINIC_OR_DEPARTMENT_OTHER): Payer: Medicare Other | Admitting: Hematology and Oncology

## 2016-06-04 ENCOUNTER — Encounter: Payer: Self-pay | Admitting: *Deleted

## 2016-06-04 ENCOUNTER — Ambulatory Visit (HOSPITAL_BASED_OUTPATIENT_CLINIC_OR_DEPARTMENT_OTHER): Payer: Medicare Other

## 2016-06-04 ENCOUNTER — Telehealth: Payer: Self-pay | Admitting: *Deleted

## 2016-06-04 VITALS — BP 141/53 | HR 92 | Temp 98.2°F | Resp 18 | Ht 63.0 in | Wt 103.8 lb

## 2016-06-04 DIAGNOSIS — C50412 Malignant neoplasm of upper-outer quadrant of left female breast: Secondary | ICD-10-CM

## 2016-06-04 DIAGNOSIS — C55 Malignant neoplasm of uterus, part unspecified: Secondary | ICD-10-CM | POA: Insufficient documentation

## 2016-06-04 DIAGNOSIS — C787 Secondary malignant neoplasm of liver and intrahepatic bile duct: Secondary | ICD-10-CM

## 2016-06-04 DIAGNOSIS — N858 Other specified noninflammatory disorders of uterus: Secondary | ICD-10-CM | POA: Insufficient documentation

## 2016-06-04 DIAGNOSIS — N183 Chronic kidney disease, stage 3 unspecified: Secondary | ICD-10-CM

## 2016-06-04 DIAGNOSIS — C541 Malignant neoplasm of endometrium: Secondary | ICD-10-CM | POA: Diagnosis not present

## 2016-06-04 DIAGNOSIS — D509 Iron deficiency anemia, unspecified: Secondary | ICD-10-CM

## 2016-06-04 DIAGNOSIS — C801 Malignant (primary) neoplasm, unspecified: Secondary | ICD-10-CM

## 2016-06-04 DIAGNOSIS — D72829 Elevated white blood cell count, unspecified: Secondary | ICD-10-CM

## 2016-06-04 LAB — COMPREHENSIVE METABOLIC PANEL
ALT: 38 U/L (ref 0–55)
AST: 62 U/L — AB (ref 5–34)
Albumin: 3.1 g/dL — ABNORMAL LOW (ref 3.5–5.0)
Alkaline Phosphatase: 242 U/L — ABNORMAL HIGH (ref 40–150)
Anion Gap: 13 mEq/L — ABNORMAL HIGH (ref 3–11)
BUN: 32.3 mg/dL — AB (ref 7.0–26.0)
CALCIUM: 9.3 mg/dL (ref 8.4–10.4)
CHLORIDE: 107 meq/L (ref 98–109)
CO2: 19 meq/L — AB (ref 22–29)
Creatinine: 1.5 mg/dL — ABNORMAL HIGH (ref 0.6–1.1)
EGFR: 37 mL/min/{1.73_m2} — ABNORMAL LOW (ref >90)
Glucose: 111 mg/dl (ref 70–140)
POTASSIUM: 4.6 meq/L (ref 3.5–5.1)
Sodium: 139 mEq/L (ref 136–145)
Total Bilirubin: 0.36 mg/dL (ref 0.20–1.20)
Total Protein: 8.1 g/dL (ref 6.4–8.3)

## 2016-06-04 LAB — CBC & DIFF AND RETIC
BASO%: 0.1 % (ref 0.0–2.0)
Basophils Absolute: 0 10*3/uL (ref 0.0–0.1)
EOS%: 0 % (ref 0.0–7.0)
Eosinophils Absolute: 0 10*3/uL (ref 0.0–0.5)
HEMATOCRIT: 19.1 % — AB (ref 34.8–46.6)
HGB: 5.7 g/dL — CL (ref 11.6–15.9)
Immature Retic Fract: 19.7 % — ABNORMAL HIGH (ref 1.60–10.00)
LYMPH#: 1.7 10*3/uL (ref 0.9–3.3)
LYMPH%: 5.1 % — ABNORMAL LOW (ref 14.0–49.7)
MCH: 18.9 pg — AB (ref 25.1–34.0)
MCHC: 29.8 g/dL — AB (ref 31.5–36.0)
MCV: 63.5 fL — ABNORMAL LOW (ref 79.5–101.0)
MONO#: 2.1 10*3/uL — ABNORMAL HIGH (ref 0.1–0.9)
MONO%: 6.3 % (ref 0.0–14.0)
NEUT#: 29.1 10*3/uL — ABNORMAL HIGH (ref 1.5–6.5)
NEUT%: 88.5 % — ABNORMAL HIGH (ref 38.4–76.8)
Platelets: 518 10*3/uL — ABNORMAL HIGH (ref 145–400)
RBC: 3.01 10*6/uL — AB (ref 3.70–5.45)
RDW: 18.1 % — ABNORMAL HIGH (ref 11.2–14.5)
Retic %: 1.01 % (ref 0.70–2.10)
Retic Ct Abs: 30.4 10*3/uL — ABNORMAL LOW (ref 33.70–90.70)
WBC: 32.9 10*3/uL — AB (ref 3.9–10.3)

## 2016-06-04 LAB — ABO/RH: ABO/RH(D): O POS

## 2016-06-04 LAB — PREPARE RBC (CROSSMATCH)

## 2016-06-04 LAB — TECHNOLOGIST REVIEW

## 2016-06-04 NOTE — Telephone Encounter (Signed)
Telephone call to patient to get her scheduled with Dr. Alvy Bimler. I asked the patient if she could come in today, but she wasn't sure because she would need to get a ride. Before making the appointment, I attempted to contact the patient's daughters. Left voicemails on both phones. Called the patient back and scheduled the appointment for 7/27 at 12:30 with Dr. Alvy Bimler. Letter to the referring office. Demographics verified.

## 2016-06-04 NOTE — Telephone Encounter (Signed)
Gave pt cal & avs °

## 2016-06-04 NOTE — Progress Notes (Signed)
[facility] NOTE  Patient Care Team: Haywood Pao, MD as PCP - General (Internal Medicine) Rosalin Hawking, MD as Consulting Physician (Neurology) Gerarda Fraction, MD as Referring Physician (Ophthalmology)  CHIEF COMPLAINTS/PURPOSE OF CONSULTATION:  Severe anemia, leukocytosis on background history of breast cancer and apparent metastatic cancer to the liver  HISTORY OF PRESENTING ILLNESS:  [name] 82 y.o. female is here because of abnormal blood work. The patient has extensive history of breast cancer. 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 TUMOR LESS THAN 0.1 CM FROM THE INFERIOR MARGIN. HIGH GRADE DUCTAL CARCINOMA IN SITU.      04/12/2007 - 04/12/2012 Anti-estrogen oral therapy    She completed 5 years of Arimidex     05/08/2016 Imaging    Mammogram show no evidence of malignancy     She has not been seen by a medical oncologist since 2013. In the interim, the patient had diagnosis of stroke but has fully recovered from it. She is on chronic antiplatelet agents. She was noted to have profound fatigue. She noticed occasional hematochezia but she thought could be related to hemorrhoidal bleeding. She was found to have severe anemia recently and has started to take oral iron supplement daily. The patient denies any recent signs or symptoms of bleeding such as spontaneous epistaxis, hematuria or hematochezia. She denies any recent abnormal breast examination, palpable mass, abnormal breast appearance or nipple changes Her primary doctor order some blood work which  show profound leukocytosis and anemia and hence she was referred here. She also underwent CT scan of the abdomen which show diffuse metastatic disease to the liver. Unfortunately, the CT scan is done at [facility] and I do not have a copy of that She denies changes in bowel habits. She had anorexia and has lost 20 pounds of weight over the past few months  MEDICAL HISTORY:  Past Medical History:  Diagnosis Date  . Cancer ([place])    breast  . Cataract   . Hypertension   . Parathyroid disease ([place])    Clarify with patient. No detail indicated on history form.  . Renal insufficiency   . Stroke [facility])     SURGICAL HISTORY: Past Surgical History:  Procedure Laterality Date  . BREAST LUMPECTOMY  2008   Confirm type with patient. Not indicated on medical history form dated 12/26/10.  Marland Kitchen CAROTID ARTERY ANGIOPLASTY Right    05/11/15  . CAROTID STENT Right 05/11/15  . PARATHYROIDECTOMY  June 2010  . PERIPHERAL VASCULAR CATHETERIZATION Right 04/02/2015   Procedure: Carotid Angiography;  Surgeon: Conrad Welcome, MD;  Location: [place] [facility];  Service: Cardiovascular;  Laterality: Right;  . PERIPHERAL VASCULAR CATHETERIZATION N/A 05/11/2015   Procedure: Carotid PTA/Stent Intervention;  Surgeon: [name], MD;  Location: [place] [facility];  Service: Cardiovascular;  Laterality: N/A;    SOCIAL HISTORY: Social History   Social History  . Marital status: Widowed    Spouse name: N/A  . Number of children: 11  . Years of education: 12+   Occupational History  . Not on file.  Social History Main Topics  . Smoking status: Never Smoker  . Smokeless tobacco: Never Used     Comment: "When using the bathroom"  . Alcohol use 0.6 oz/week    1 Glasses of wine per week     Comment: Glass of wine on holidays.  . Drug use: No  . Sexual activity: Not on file   Other Topics Concern  . Not on file   Social History Narrative   Lives at home with her two sons and grandson.   Caffeine use:  Drinks 1 cup coffee per day   Drinks 2 cans soda per day       Right handed    FAMILY HISTORY: Family History  Problem Relation Age of Onset  . Stroke Mother   . Hypertension Mother   . Stroke Father   . Cancer Sister     stomach ca  . Cancer Sister     stomach ca    ALLERGIES:  has No Known Allergies.  MEDICATIONS:  Current Outpatient Prescriptions  Medication Sig Dispense Refill  . aspirin EC 81 MG EC tablet Take 1 tablet (81 mg total) by mouth daily. 30 tablet 6  . Cholecalciferol (VITAMIN D3 PO) Take 1,000 Units by mouth.    . clopidogrel (PLAVIX) 75 MG tablet TAKE 1 TABLET BY MOUTH DAILY 90 tablet 0  . felodipine (PLENDIL) 10 MG 24 hr tablet Take 10 mg by mouth daily.    . ferrous sulfate 325 (65 FE) MG tablet Take 325 mg by mouth daily with breakfast. TAKES 2 DAILY    . ibandronate (BONIVA) 150 MG tablet Take 150 mg by mouth every 30 (thirty) days. Pt takes on 6th --Take in the morning with a full glass of water, on an empty stomach, and do not take anything else by mouth or lie down for the next 30 min.    Marland Kitchen losartan (COZAAR) 25 MG tablet Take 25 mg by mouth daily.    . metoprolol succinate (TOPROL-XL) 50 MG 24 hr tablet Take 50 mg by mouth daily.     . potassium chloride SA (K-DUR,KLOR-CON) 20 MEQ tablet Take 20 mEq by mouth 2 (two) times daily.    . pravastatin (PRAVACHOL) 40 MG tablet Take 40 mg by mouth every evening.      No current facility-administered medications for this visit.     REVIEW OF SYSTEMS:   Constitutional: Denies fevers, chills or abnormal night sweats Eyes: Denies blurriness of vision, double vision or watery eyes Ears, nose, mouth, throat, and face: Denies mucositis or sore throat Respiratory: Denies cough, dyspnea or wheezes Cardiovascular: Denies palpitation, chest discomfort or lower extremity swelling Gastrointestinal:  Denies nausea, heartburn or change in bowel habits Skin: Denies abnormal skin rashes Lymphatics: Denies new  lymphadenopathy or easy bruising Neurological:Denies numbness, tingling or new weaknesses Behavioral/Psych: Mood is stable, no new changes  All other systems were reviewed with the patient and are negative.  PHYSICAL EXAMINATION: ECOG PERFORMANCE STATUS: 1 - Symptomatic but completely ambulatory  Vitals:   06/04/16 1326  BP: (!) 141/53  Pulse: 92  Resp: 18  Temp: 98.2 F (36.8 C)   Filed Weights   06/04/16 1326  Weight: 103 lb 12.8 oz (47.1 kg)    GENERAL:alert, no distress and comfortable. She looks thin and cachectic SKIN: skin color, texture, turgor are normal, no rashes or significant lesions EYES: normal, conjunctiva are pale and non-injected, sclera clear OROPHARYNX:no exudate, no erythema and lips, buccal mucosa, and tongue normal  NECK: supple, thyroid normal size, non-tender, without nodularity LYMPH:  no palpable lymphadenopathy in the cervical, axillary or inguinal LUNGS: clear to auscultation and percussion with normal breathing effort HEART: regular rate & rhythm and no murmurs and no lower extremity edema ABDOMEN:abdomen soft, palpable liver enlargement Musculoskeletal:no cyanosis of digits and no clubbing  PSYCH: alert & oriented x 3 with fluent speech NEURO: no focal motor/sensory deficits Bilateral breast examination did not reveal any abnormalities apart from her lumpectomy scar  LABORATORY DATA:  I have reviewed the data as listed Lab Results  Component Value Date   WBC 32.9 (H) 06/04/2016   HGB 5.7 (LL) 06/04/2016   HCT 19.1 (L) 06/04/2016   MCV 63.5 (L) 06/04/2016   PLT 518 (H) 06/04/2016    Recent Labs  12/05/15 1530 06/04/16 1428  NA 141 139  K 3.8 4.6  CL 107  --   CO2 23 19*  GLUCOSE 139* 111  BUN 15 32.3*  CREATININE 1.29* 1.5*  CALCIUM 8.6* 9.3  GFRNONAA 37*  --   GFRAA 43*  --   PROT  --  8.1  ALBUMIN  --  3.1*  AST  --  62*  ALT  --  38  ALKPHOS  --  242*  BILITOT  --  0.36    RADIOGRAPHIC STUDIES: I have personally  reviewed the radiological images as listed and agreed with the findings in the report. Mm Screening Breast Tomo Bilateral  Result Date: 05/12/2016 CLINICAL DATA:  Screening. EXAM: 2D DIGITAL SCREENING BILATERAL MAMMOGRAM WITH CAD AND ADJUNCT TOMO COMPARISON:  Previous exam(s). ACR Breast Density Category b: There are scattered areas of fibroglandular density. FINDINGS: There are no findings suspicious for malignancy. Images were processed with CAD. IMPRESSION: No mammographic evidence of malignancy. A result letter of this screening mammogram will be mailed directly to the patient. RECOMMENDATION: Screening mammogram in one year. (Code:SM-B-01Y) BI-RADS CATEGORY  1: Negative. Electronically Signed   By: Lajean Manes M.D.   On: 05/12/2016 10:12    ASSESSMENT & PLAN:  Breast cancer of upper-outer quadrant of left female breast ([place]) Her last mammogram was negative. Given her remote history of breast cancer and strong family history of cancers CT imaging showed evidence of metastatic disease in the liver. I will arrange for liver biopsy. I will arrange for CT imaging to be sent here. I will order a CT scan of the chest with IV contrast due to chronic renal disease to complete her staging  Metastasis to liver of unknown origin [facility]) The source of the metastatic cancer in the liver is unknown. I will arrange for CT-guided biopsy I will also order tumor markers  Uterine cancer ([place]) The outside imaging studies show evidence of uterine mass. The patient has strong family history of cancer and personal history of breast cancer. Certainly undiagnosed ovarian or uterine cancer are possibilities. I will order tumor markers  Iron deficiency anemia The patient is on chronic antiplatelet agent for history of stroke and has significant anemia. I suspect she may have iron deficiency anemia. Given the severe ED of her anemia, I will arrange for units of blood transfusion. Surprisingly, the patient is  relatively asymptomatic and she prefers to delay her blood transfusion until Friday. We discussed some of the risks, benefits, and alternatives of blood transfusions. The patient is symptomatic from anemia and the hemoglobin level is critically low.  Some of the side-effects to be expected including risks of transfusion reactions, chills, infection, syndrome of volume overload and  risk of hospitalization from various reasons and the patient is willing to proceed and went ahead to sign consent today.   Chronic kidney disease, stage III (moderate) She has chronic kidney disease stage III 24. For this reason, I will order IV contrast  Leukocytosis Cause is unknown. Workup in progress. She does not look clinically septic. She does not need antibiotic therapy   Orders Placed This Encounter  Procedures  . CT BIOPSY    Standing Status:   Future    Standing Expiration Date:   07/09/2017    Order Specific Question:   Reason for Exam (SYMPTOM  OR DIAGNOSIS REQUIRED)    Answer:   metastasis to liver    Order Specific Question:   Preferred imaging location?    Answer:   [facility]  . CBC & Diff and Retic    Standing Status:   Future    Number of Occurrences:   1    Standing Expiration Date:   07/09/2017  . Ferritin    Standing Status:   Future    Number of Occurrences:   1    Standing Expiration Date:   07/09/2017  . Comprehensive metabolic panel    Standing Status:   Future    Number of Occurrences:   1    Standing Expiration Date:   07/09/2017  . CA 125    Standing Status:   Future    Number of Occurrences:   1    Standing Expiration Date:   07/09/2017  . Cancer antigen 27.29    Standing Status:   Future    Number of Occurrences:   1    Standing Expiration Date:   07/09/2017  . Hold Tube, Blood Bank    Standing Status:   Future    Number of Occurrences:   1    Standing Expiration Date:   07/09/2017      All questions were answered. The patient knows to call the clinic with  any problems, questions or concerns. I spent 55 minutes counseling the patient face to face. The total time spent in the appointment was 60 minutes and more than 50% was on counseling.     [facility], Anthonette Lesage, MD 06/05/2016 12:35 PM

## 2016-06-04 NOTE — Telephone Encounter (Signed)
Called [facility] ph [phone] to request CT on disc sent to Dr. Alvy Bimler ASAP.   S/w Receptionist who instructs to fax over release of info to fax [phone].   Release Faxed.

## 2016-06-05 ENCOUNTER — Ambulatory Visit: Payer: Medicare Other | Admitting: Hematology and Oncology

## 2016-06-05 ENCOUNTER — Other Ambulatory Visit: Payer: Self-pay | Admitting: Hematology and Oncology

## 2016-06-05 ENCOUNTER — Encounter: Payer: Self-pay | Admitting: Hematology and Oncology

## 2016-06-05 DIAGNOSIS — C50412 Malignant neoplasm of upper-outer quadrant of left female breast: Secondary | ICD-10-CM

## 2016-06-05 DIAGNOSIS — D72829 Elevated white blood cell count, unspecified: Secondary | ICD-10-CM | POA: Insufficient documentation

## 2016-06-05 DIAGNOSIS — N183 Chronic kidney disease, stage 3 unspecified: Secondary | ICD-10-CM | POA: Insufficient documentation

## 2016-06-05 LAB — CANCER ANTIGEN 27.29: CA 27.29: 28.3 U/mL (ref 0.0–38.6)

## 2016-06-05 LAB — CA 125: Cancer Antigen (CA) 125: 134.6 U/mL — ABNORMAL HIGH (ref 0.0–38.1)

## 2016-06-05 LAB — FERRITIN: FERRITIN: 129 ng/mL (ref 9–269)

## 2016-06-05 NOTE — Assessment & Plan Note (Signed)
She has chronic kidney disease stage III 24. For this reason, I will order IV contrast

## 2016-06-05 NOTE — Assessment & Plan Note (Signed)
The source of the metastatic cancer in the liver is unknown. I will arrange for CT-guided biopsy I will also order tumor markers

## 2016-06-05 NOTE — Assessment & Plan Note (Signed)
The patient is on chronic antiplatelet agent for history of stroke and has significant anemia. I suspect she may have iron deficiency anemia. Given the severe ED of her anemia, I will arrange for units of blood transfusion. Surprisingly, the patient is relatively asymptomatic and she prefers to delay her blood transfusion until Friday. We discussed some of the risks, benefits, and alternatives of blood transfusions. The patient is symptomatic from anemia and the hemoglobin level is critically low.  Some of the side-effects to be expected including risks of transfusion reactions, chills, infection, syndrome of volume overload and risk of hospitalization from various reasons and the patient is willing to proceed and went ahead to sign consent today.

## 2016-06-05 NOTE — Assessment & Plan Note (Signed)
The outside imaging studies show evidence of uterine mass. The patient has strong family history of cancer and personal history of breast cancer. Certainly undiagnosed ovarian or uterine cancer are possibilities. I will order tumor markers

## 2016-06-05 NOTE — Assessment & Plan Note (Signed)
Cause is unknown. Workup in progress. She does not look clinically septic. She does not need antibiotic therapy

## 2016-06-05 NOTE — Assessment & Plan Note (Signed)
Her last mammogram was negative. Given her remote history of breast cancer and strong family history of cancers CT imaging showed evidence of metastatic disease in the liver. I will arrange for liver biopsy. I will arrange for CT imaging to be sent here. I will order a CT scan of the chest with IV contrast due to chronic renal disease to complete her staging

## 2016-06-06 ENCOUNTER — Ambulatory Visit (HOSPITAL_BASED_OUTPATIENT_CLINIC_OR_DEPARTMENT_OTHER): Payer: Medicare Other

## 2016-06-06 ENCOUNTER — Telehealth: Payer: Self-pay | Admitting: *Deleted

## 2016-06-06 DIAGNOSIS — D509 Iron deficiency anemia, unspecified: Secondary | ICD-10-CM | POA: Diagnosis not present

## 2016-06-06 MED ORDER — SODIUM CHLORIDE 0.9 % IV SOLN
250.0000 mL | Freq: Once | INTRAVENOUS | Status: AC
  Administered 2016-06-06: 250 mL via INTRAVENOUS

## 2016-06-06 MED ORDER — DIPHENHYDRAMINE HCL 25 MG PO CAPS
ORAL_CAPSULE | ORAL | Status: AC
  Filled 2016-06-06: qty 1

## 2016-06-06 MED ORDER — DIPHENHYDRAMINE HCL 25 MG PO CAPS
25.0000 mg | ORAL_CAPSULE | Freq: Once | ORAL | Status: AC
  Administered 2016-06-06: 25 mg via ORAL

## 2016-06-06 MED ORDER — ACETAMINOPHEN 325 MG PO TABS
ORAL_TABLET | ORAL | Status: AC
  Filled 2016-06-06: qty 2

## 2016-06-06 MED ORDER — ACETAMINOPHEN 325 MG PO TABS
650.0000 mg | ORAL_TABLET | Freq: Once | ORAL | Status: AC
  Administered 2016-06-06: 650 mg via ORAL

## 2016-06-06 NOTE — Telephone Encounter (Signed)
Called [facility] [phone]) again this afternoon to f/u on request for CT scan which was done on 7/25.

## 2016-06-06 NOTE — Telephone Encounter (Signed)
Received CT Scan from [facility].  Taken to Radiology dept and was loaded into our "[facility]" system.    Message sent to [facility] in Radiology Scheduler so she can request Radiologist review to get CT guided biopsy scheduled.

## 2016-06-06 NOTE — Telephone Encounter (Signed)
LVM for Medical Records at [facility] to f/u on request for CT scan.  Faxed over release of information on 06/04/16.

## 2016-06-06 NOTE — Patient Instructions (Signed)
Blood Transfusion, Care After °Refer to this sheet in the next few weeks. These instructions provide you with information about caring for yourself after your procedure. Your health care provider may also give you more specific instructions. Your treatment has been planned according to current medical practices, but problems sometimes occur. Call your health care provider if you have any problems or questions after your procedure. °WHAT TO EXPECT AFTER THE PROCEDURE °After your procedure, it is common to have: °· Bruising and soreness at the IV site. °· Chills or fever. °· Headache. °HOME CARE INSTRUCTIONS °· Take medicines only as directed by your health care provider. Ask your health care provider if you can take an over-the-counter pain reliever in case you have a fever or headache a day or two after your transfusion. °· Return to your normal activities as directed by your health care provider. °SEEK MEDICAL CARE IF:  °· You develop redness or irritation at your IV site. °· You have persistent fever, chills, or headache. °· Your urine is darker than normal. °· Your urine turns pink, red, or brown.   °· The white part of your eye turns yellow (jaundice).   °· You feel weak after doing your normal activities.   °SEEK IMMEDIATE MEDICAL CARE IF:  °· You have trouble breathing. °· You have fever and chills along with: °· Anxiety. °· Chest or back pain. °· Flushed skin. °· Clammy skin. °· A rapid heartbeat. °· Nausea. °  °This information is not intended to replace advice given to you by your health care provider. Make sure you discuss any questions you have with your health care provider. °  °Document Released: 11/17/2014 Document Reviewed: 11/17/2014 °Elsevier Interactive Patient Education ©2016 [employer] ° °Blood Transfusion  °A blood transfusion is a procedure in which you receive donated blood through an IV tube. You may need a blood transfusion because of illness, surgery, or injury. The blood may come from a  donor, or it may be your own blood that you donated previously. °The blood given in a transfusion is made up of different types of cells. You may receive: °· Red blood cells. These carry oxygen and replace lost blood. °· Platelets. These control bleeding. °· Plasma. This helps blood to clot. °If you have hemophilia or another clotting disorder, you may also receive other types of blood products. °LET YOUR HEALTH CARE PROVIDER KNOW ABOUT: °· Any allergies you have. °· All medicines you are taking, including vitamins, herbs, eye drops, creams, and over-the-counter medicines. °· Previous problems you or members of your family have had with the use of anesthetics. °· Any blood disorders you have. °· Previous surgeries you have had. °· Any medical conditions you may have. °· Any previous reactions you have had during a blood transfusion.   °RISKS AND COMPLICATIONS °Generally, this is a safe procedure. However, problems may occur, including: °· Having an allergic reaction to something in the donated blood. °· Fever. This may be a reaction to the white blood cells in the transfused blood. °· Iron overload. This can happen from having many transfusions. °· Transfusion-related acute lung injury (TRALI). This is a rare reaction that causes lung damage. The cause is not known. TRALI can occur within hours of a transfusion or several days later. °· Sudden (acute) or delayed hemolytic reactions. This happens if your blood does not match the cells in your transfusion. Your body's defense system (immune system) may try to attack the new cells. This complication is rare. °· Infection. This is rare. °BEFORE THE   PROCEDURE °· You may have a blood test to determine your blood type. This is necessary to know what kind of blood your body will accept. °· If you are going to have a planned surgery, you may donate your own blood. This may be done in case you need to have a transfusion. °· If you have had an allergic reaction to a  transfusion in the past, you may be given medicine to help prevent a reaction. Take this medicine only as directed by your health care provider. °· You will have your temperature, blood pressure, and pulse monitored before the transfusion. °PROCEDURE  °· An IV will be started in your hand or arm. °· The bag of donated blood will be attached to your IV tube and given into your vein. °· Your temperature, blood pressure, and pulse will be monitored regularly during the transfusion. This monitoring is done to detect early signs of a transfusion reaction. °· If you have any signs or symptoms of a reaction, your transfusion will be stopped and you may be given medicine. °· When the transfusion is over, your IV will be removed. °· Pressure may be applied to the IV site for a few minutes. °· A bandage (dressing) will be applied. °The procedure may vary among health care providers and hospitals. °AFTER THE PROCEDURE °· Your blood pressure, temperature, and pulse will be monitored regularly. °  °This information is not intended to replace advice given to you by your health care provider. Make sure you discuss any questions you have with your health care provider. °  °Document Released: 10/24/2000 Document Revised: 11/17/2014 Document Reviewed: 09/06/2014 °Elsevier Interactive Patient Education ©2016 [employer] Inc[employer] ° °

## 2016-06-08 LAB — TYPE AND SCREEN
ABO/RH(D): O POS
ANTIBODY SCREEN: NEGATIVE
UNIT DIVISION: 0
UNIT DIVISION: 0

## 2016-06-09 ENCOUNTER — Telehealth: Payer: Self-pay | Admitting: *Deleted

## 2016-06-09 ENCOUNTER — Other Ambulatory Visit: Payer: Self-pay | Admitting: Hematology and Oncology

## 2016-06-09 ENCOUNTER — Inpatient Hospital Stay
Admission: RE | Admit: 2016-06-09 | Discharge: 2016-06-09 | Disposition: A | Discharge status: Home / Self Care | Payer: Self-pay | Source: Ambulatory Visit | Attending: Hematology and Oncology | Admitting: Hematology and Oncology

## 2016-06-09 ENCOUNTER — Telehealth (HOSPITAL_COMMUNITY): Payer: Self-pay

## 2016-06-09 ENCOUNTER — Ambulatory Visit (HOSPITAL_COMMUNITY)
Admission: RE | Admit: 2016-06-09 | Discharge: 2016-06-09 | Disposition: A | Discharge status: Home / Self Care | Payer: Medicare Other | Source: Ambulatory Visit | Attending: Hematology and Oncology | Admitting: Hematology and Oncology

## 2016-06-09 DIAGNOSIS — R590 Localized enlarged lymph nodes: Secondary | ICD-10-CM | POA: Diagnosis not present

## 2016-06-09 DIAGNOSIS — K769 Liver disease, unspecified: Secondary | ICD-10-CM | POA: Insufficient documentation

## 2016-06-09 DIAGNOSIS — C801 Malignant (primary) neoplasm, unspecified: Secondary | ICD-10-CM

## 2016-06-09 DIAGNOSIS — C50412 Malignant neoplasm of upper-outer quadrant of left female breast: Secondary | ICD-10-CM

## 2016-06-09 DIAGNOSIS — D509 Iron deficiency anemia, unspecified: Secondary | ICD-10-CM

## 2016-06-09 NOTE — Telephone Encounter (Signed)
Daughter notified of scan to be done on 8/7 @ 1:00pm at [facility]. Pt is to arrive at 11:30, NPO after 0700, OK to take meds for heart and HTN that morning. Pt is to stop plavix for 5 days prior to scan

## 2016-06-09 NOTE — Telephone Encounter (Signed)
Correction to previous note- Pt is having Biopsy on 8/7

## 2016-06-10 ENCOUNTER — Ambulatory Visit (HOSPITAL_COMMUNITY)
Admission: RE | Admit: 2016-06-10 | Discharge: 2016-06-10 | Disposition: A | Discharge status: Home / Self Care | Payer: Medicare Other | Source: Ambulatory Visit | Attending: Hematology and Oncology | Admitting: Hematology and Oncology

## 2016-06-12 ENCOUNTER — Other Ambulatory Visit: Payer: Self-pay | Admitting: *Deleted

## 2016-06-12 ENCOUNTER — Telehealth: Payer: Self-pay | Admitting: *Deleted

## 2016-06-12 ENCOUNTER — Encounter: Payer: Self-pay | Admitting: Hematology and Oncology

## 2016-06-12 ENCOUNTER — Other Ambulatory Visit: Payer: Self-pay | Admitting: Radiology

## 2016-06-12 MED ORDER — MAGIC MOUTHWASH W/LIDOCAINE: 5.0000 mL | Freq: Four times a day (QID) | ORAL | 0 refills | Status: DC | PRN

## 2016-06-12 NOTE — Telephone Encounter (Signed)
LM for daughter to call regarding mouth sores. We will call in magic mouth wash to pharmacy of choice

## 2016-06-12 NOTE — Telephone Encounter (Signed)
-----   Message from Heath Lark, MD sent at 06/12/2016  9:55 AM EDT ----- Regarding: magic mouth wash pls call in

## 2016-06-16 ENCOUNTER — Ambulatory Visit (HOSPITAL_COMMUNITY)
Admission: RE | Admit: 2016-06-16 | Discharge: 2016-06-16 | Disposition: A | Discharge status: Home / Self Care | Payer: Medicare Other | Source: Ambulatory Visit | Attending: Hematology and Oncology | Admitting: Hematology and Oncology

## 2016-06-16 ENCOUNTER — Encounter (HOSPITAL_COMMUNITY): Payer: Self-pay

## 2016-06-16 DIAGNOSIS — Z7902 Long term (current) use of antithrombotics/antiplatelets: Secondary | ICD-10-CM | POA: Insufficient documentation

## 2016-06-16 DIAGNOSIS — Z79899 Other long term (current) drug therapy: Secondary | ICD-10-CM | POA: Insufficient documentation

## 2016-06-16 DIAGNOSIS — Z7982 Long term (current) use of aspirin: Secondary | ICD-10-CM | POA: Diagnosis not present

## 2016-06-16 DIAGNOSIS — C787 Secondary malignant neoplasm of liver and intrahepatic bile duct: Secondary | ICD-10-CM | POA: Diagnosis not present

## 2016-06-16 DIAGNOSIS — I10 Essential (primary) hypertension: Secondary | ICD-10-CM | POA: Insufficient documentation

## 2016-06-16 DIAGNOSIS — C801 Malignant (primary) neoplasm, unspecified: Secondary | ICD-10-CM | POA: Insufficient documentation

## 2016-06-16 LAB — CBC
HEMATOCRIT: 32.4 % — AB (ref 36.0–46.0)
Hemoglobin: 10.1 g/dL — ABNORMAL LOW (ref 12.0–15.0)
MCH: 22.6 pg — AB (ref 26.0–34.0)
MCHC: 31.2 g/dL (ref 30.0–36.0)
MCV: 72.6 fL — AB (ref 78.0–100.0)
PLATELETS: 435 10*3/uL — AB (ref 150–400)
RBC: 4.46 MIL/uL (ref 3.87–5.11)
RDW: 26.9 % — AB (ref 11.5–15.5)
WBC: 36.1 10*3/uL — AB (ref 4.0–10.5)

## 2016-06-16 LAB — APTT: aPTT: 30 seconds (ref 24–36)

## 2016-06-16 LAB — PROTIME-INR
INR: 1.07
Prothrombin Time: 13.9 seconds (ref 11.4–15.2)

## 2016-06-16 MED ORDER — FENTANYL CITRATE (PF) 100 MCG/2ML IJ SOLN
INTRAMUSCULAR | Status: AC
  Filled 2016-06-16: qty 2

## 2016-06-16 MED ORDER — MIDAZOLAM HCL 2 MG/2ML IJ SOLN
INTRAMUSCULAR | Status: AC
  Filled 2016-06-16: qty 2

## 2016-06-16 MED ORDER — FENTANYL CITRATE (PF) 100 MCG/2ML IJ SOLN
INTRAMUSCULAR | Status: AC | PRN
  Administered 2016-06-16: 50 ug via INTRAVENOUS

## 2016-06-16 MED ORDER — GELATIN ABSORBABLE 12-7 MM EX MISC
CUTANEOUS | Status: AC
  Filled 2016-06-16: qty 1

## 2016-06-16 MED ORDER — LIDOCAINE HCL 1 % IJ SOLN
INTRAMUSCULAR | Status: AC
  Filled 2016-06-16: qty 20

## 2016-06-16 MED ORDER — MIDAZOLAM HCL 2 MG/2ML IJ SOLN
INTRAMUSCULAR | Status: AC | PRN
  Administered 2016-06-16: 0.5 mg via INTRAVENOUS

## 2016-06-16 MED ORDER — SODIUM CHLORIDE 0.9 % IV SOLN: INTRAVENOUS | Status: DC

## 2016-06-16 NOTE — Sedation Documentation (Signed)
Received report from Tyrone Nine, RN

## 2016-06-16 NOTE — Sedation Documentation (Signed)
Patient is resting comfortably. No complaints

## 2016-06-16 NOTE — H&P (Signed)
Chief Complaint: Liver mass  Referring Physician(s): [place]  Supervising Physician: Arne Cleveland  Patient Status: Outpatient  History of Present Illness: [name] is a 82 y.o. female with history of high grade ductal carcinoma of the left breast which was initially diagnosed in 2008.   She completed 5 years of Arimidex.  She was recently found to be profoundly anemic with Hgb of 5.7 and required transfusion.   She does have history of bleeding hemorrhoids.  CT scan done at [facility] showed diffuse metastatic disease to the liver.  Her CA125 is 134.6  She denies fever or chills but does report a 20 lb weight loss over the past couple of months.  She takes Plavix because of a stroke history (carotid stent done 05/2015) and has held this x 5 days.  Past Medical History:  Diagnosis Date  . Cancer ([place])    breast  . Cataract   . Hypertension   . Parathyroid disease ([place])    Clarify with patient. No detail indicated on history form.  . Renal insufficiency   . Stroke [facility])     Past Surgical History:  Procedure Laterality Date  . BREAST LUMPECTOMY  2008   Confirm type with patient. Not indicated on medical history form dated 12/26/10.  Marland Kitchen CAROTID ARTERY ANGIOPLASTY Right    05/11/15  . CAROTID STENT Right 05/11/15  . PARATHYROIDECTOMY  June 2010  . PERIPHERAL VASCULAR CATHETERIZATION Right 04/02/2015   Procedure: Carotid Angiography;  Surgeon: Conrad Harmonsburg, MD;  Location: [place] CV LAB;  Service: Cardiovascular;  Laterality: Right;  . PERIPHERAL VASCULAR CATHETERIZATION N/A 05/11/2015   Procedure: Carotid PTA/Stent Intervention;  Surgeon: [name], MD;  Location: [place] CV LAB;  Service: Cardiovascular;  Laterality: N/A;    Allergies: Review of patient's allergies indicates no known allergies.  Medications: Prior to Admission medications   Medication Sig Start Date End Date Taking? Authorizing Provider  aspirin EC 81 MG EC tablet Take 1 tablet  (81 mg total) by mouth daily. 03/09/15   Ripudeep Krystal Eaton, MD  Cholecalciferol (VITAMIN D3 PO) Take 1,000 Units by mouth.    Historical Provider, MD  clopidogrel (PLAVIX) 75 MG tablet TAKE 1 TABLET BY MOUTH DAILY 05/27/16   [name], MD  felodipine (PLENDIL) 10 MG 24 hr tablet Take 10 mg by mouth daily.    Historical Provider, MD  ferrous sulfate 325 (65 FE) MG tablet Take 325 mg by mouth daily with breakfast. TAKES 2 DAILY    Historical Provider, MD  ibandronate (BONIVA) 150 MG tablet Take 150 mg by mouth every 30 (thirty) days. Pt takes on 6th --Take in the morning with a full glass of water, on an empty stomach, and do not take anything else by mouth or lie down for the next 30 min.    Historical Provider, MD  losartan (COZAAR) 25 MG tablet Take 25 mg by mouth daily.    Historical Provider, MD  magic mouthwash w/lidocaine SOLN Take 5 mLs by mouth 4 (four) times daily as needed for mouth pain. 06/12/16   Heath Lark, MD  metoprolol succinate (TOPROL-XL) 50 MG 24 hr tablet Take 50 mg by mouth daily.  02/26/15   Historical Provider, MD  potassium chloride SA (K-DUR,KLOR-CON) 20 MEQ tablet Take 20 mEq by mouth 2 (two) times daily.    Historical Provider, MD  pravastatin (PRAVACHOL) 40 MG tablet Take 40 mg by mouth every evening.     Historical Provider, MD  Family History  Problem Relation Age of Onset  . Stroke Mother   . Hypertension Mother   . Stroke Father   . Cancer Sister     stomach ca  . Cancer Sister     stomach ca    Social History   Social History  . Marital status: Widowed    Spouse name: N/A  . Number of children: 11  . Years of education: 12+   Social History Main Topics  . Smoking status: Never Smoker  . Smokeless tobacco: Never Used     Comment: "When using the bathroom"  . Alcohol use 0.6 oz/week    1 Glasses of wine per week     Comment: Glass of wine on holidays.  . Drug use: No  . Sexual activity: Not Asked   Other Topics Concern  . None   Social  History Narrative   Lives at home with her two sons and grandson.   Caffeine use: Drinks 1 cup coffee per day   Drinks 2 cans soda per day       Right handed    Review of Systems: A 12 point ROS discussed  Review of Systems  Constitutional: Positive for fatigue. Negative for activity change, appetite change, chills and fever.  HENT: Negative.   Respiratory: Negative for cough and shortness of breath.   Cardiovascular: Negative for chest pain.  Gastrointestinal: Negative for abdominal pain, nausea and vomiting.  Genitourinary: Negative.   Musculoskeletal: Negative.   Skin: Negative.   Neurological: Negative.   Hematological:       Anemia  Psychiatric/Behavioral: Negative.     Vital Signs: BP 125/70   Pulse (!) 42   Temp 98.1 F (36.7 C)   Resp 14   Ht 5\' 3"  (1.6 m)   Wt 103 lb (46.7 kg)   SpO2 100%   BMI 18.25 kg/m   Physical Exam  Constitutional: She is oriented to person, place, and time.  Thin, elderly, NAD  HENT:  Head: Normocephalic and atraumatic.  Eyes: EOM are normal.  Neck: Normal range of motion.  Cardiovascular: Normal rate, regular rhythm and normal heart sounds.   Pulmonary/Chest: Effort normal and breath sounds normal. She has no wheezes.  Abdominal: Soft. She exhibits no distension. There is no tenderness.  Musculoskeletal: Normal range of motion.  Lymphadenopathy:    She has no cervical adenopathy.  Neurological: She is alert and oriented to person, place, and time.  Skin: Skin is warm and dry.  Psychiatric: Her behavior is normal. Judgment and thought content normal.    Mallampati Score:  MD Evaluation Airway: WNL Heart: WNL Abdomen: WNL Chest/ Lungs: WNL ASA  Classification: 3 Mallampati/Airway Score: One  Imaging: Ct Chest Wo Contrast  Result Date: 06/09/2016 CLINICAL DATA:  Metastatic cancer to the liver seen on CT performed at outside imaging location. 20 pound weight loss. EXAM: CT CHEST WITHOUT CONTRAST TECHNIQUE: Multidetector  CT imaging of the chest was performed following the standard protocol without IV contrast. COMPARISON:  None. FINDINGS: Cardiovascular: Normal heart size. Aortic atherosclerosis is noted. Calcification within the RCA, LAD and left circumflex coronary artery noted. Mediastinum/Nodes: The trachea is patent and is midline. Unremarkable appearance of the esophagus. No enlarged mediastinal or hilar lymph nodes. No axillary or supraclavicular adenopathy. Lungs/Pleura: There is no pleural fluid identified. No airspace consolidation or atelectasis. No suspicious pulmonary nodules or masses. Upper Abdomen: Numerous suspicious lesions are identified throughout the liver compatible with the provided clinical history metastatic disease to the  liver. Largest lesion is identified centrally measuring 8.1 cm, image 120 of series 2. The visualized portions of the kidneys and spleen are normal. Enlarged gastrohepatic ligament lymph node Measures 2.2 cm, image 137 of series 2. Musculoskeletal: No aggressive lytic or sclerotic bone lesions identified. IMPRESSION: 1. No evidence for pulmonary metastases. 2. Numerous liver lesions identified and compatible with metastatic cancer to the liver. 3. Pathologically enlarged gastrohepatic ligament lymph node noted within the upper abdomen. Electronically Signed   By: Kerby Moors M.D.   On: 06/09/2016 16:33    Labs:  CBC:  Recent Labs  12/05/15 1530 06/04/16 1428  WBC 8.4 32.9*  HGB 7.4* 5.7*  HCT 23.1* 19.1*  PLT 349 518*    COAGS: No results for input(s): INR, APTT in the last 8760 hours.  BMP:  Recent Labs  12/05/15 1530 06/04/16 1428  NA 141 139  K 3.8 4.6  CL 107  --   CO2 23 19*  GLUCOSE 139* 111  BUN 15 32.3*  CALCIUM 8.6* 9.3  CREATININE 1.29* 1.5*  GFRNONAA 37*  --   GFRAA 43*  --     LIVER FUNCTION TESTS:  Recent Labs  06/04/16 1428  BILITOT 0.36  AST 62*  ALT 38  ALKPHOS 242*  PROT 8.1  ALBUMIN 3.1*    TUMOR MARKERS: No results  for input(s): AFPTM, CEA, CA199, CHROMGRNA in the last 8760 hours.  Assessment and Plan:  History of breast cancer in 2008, now with metastatic disease to the liver.  Anemia = Hgb today 10.1  Will proceed with Ultrasound guided biopsy of a liver mass today by Dr. Vernard Gambles   Risks and Benefits discussed with the patient including, but not limited to bleeding, infection, damage to adjacent structures or low yield requiring additional tests.  All of the patient's questions were answered, patient is agreeable to proceed. Consent signed and in chart.  Thank you for this interesting consult.  I greatly enjoyed meeting [name] and look forward to participating in their care.  A copy of this report was sent to the requesting provider on this date.  Electronically Signed: Murrell Redden PA-C 06/16/2016, 12:36 PM   I spent a total of  30 Minutes in face to face in clinical consultation, greater than 50% of which was counseling/coordinating care for US liver biopsy.

## 2016-06-16 NOTE — Sedation Documentation (Signed)
Patient is resting comfortably. 

## 2016-06-16 NOTE — Discharge Instructions (Signed)

## 2016-06-16 NOTE — Procedures (Signed)
US core liver lesion 18g x3 to surg path No complication No blood loss. See complete dictation in [facility] PACS.  

## 2016-06-18 ENCOUNTER — Ambulatory Visit (HOSPITAL_BASED_OUTPATIENT_CLINIC_OR_DEPARTMENT_OTHER): Payer: Medicare Other | Admitting: Hematology and Oncology

## 2016-06-18 ENCOUNTER — Encounter: Payer: Self-pay | Admitting: Hematology and Oncology

## 2016-06-18 ENCOUNTER — Other Ambulatory Visit (HOSPITAL_BASED_OUTPATIENT_CLINIC_OR_DEPARTMENT_OTHER): Payer: Medicare Other

## 2016-06-18 ENCOUNTER — Telehealth: Payer: Self-pay | Admitting: *Deleted

## 2016-06-18 DIAGNOSIS — D509 Iron deficiency anemia, unspecified: Secondary | ICD-10-CM | POA: Diagnosis not present

## 2016-06-18 DIAGNOSIS — D72829 Elevated white blood cell count, unspecified: Secondary | ICD-10-CM

## 2016-06-18 DIAGNOSIS — Z853 Personal history of malignant neoplasm of breast: Secondary | ICD-10-CM | POA: Diagnosis not present

## 2016-06-18 DIAGNOSIS — C801 Malignant (primary) neoplasm, unspecified: Secondary | ICD-10-CM | POA: Diagnosis not present

## 2016-06-18 DIAGNOSIS — C787 Secondary malignant neoplasm of liver and intrahepatic bile duct: Secondary | ICD-10-CM

## 2016-06-18 DIAGNOSIS — Z7189 Other specified counseling: Secondary | ICD-10-CM | POA: Insufficient documentation

## 2016-06-18 DIAGNOSIS — C50412 Malignant neoplasm of upper-outer quadrant of left female breast: Secondary | ICD-10-CM

## 2016-06-18 LAB — CBC & DIFF AND RETIC
BASO%: 0.1 % (ref 0.0–2.0)
Basophils Absolute: 0.1 10*3/uL (ref 0.0–0.1)
EOS ABS: 0 10*3/uL (ref 0.0–0.5)
EOS%: 0.1 % (ref 0.0–7.0)
HCT: 28.7 % — ABNORMAL LOW (ref 34.8–46.6)
HGB: 9 g/dL — ABNORMAL LOW (ref 11.6–15.9)
IMMATURE RETIC FRACT: 20 % — AB (ref 1.60–10.00)
LYMPH%: 4.4 % — ABNORMAL LOW (ref 14.0–49.7)
MCH: 22.2 pg — ABNORMAL LOW (ref 25.1–34.0)
MCHC: 31.4 g/dL — AB (ref 31.5–36.0)
MCV: 70.9 fL — AB (ref 79.5–101.0)
MONO#: 2.7 10*3/uL — AB (ref 0.1–0.9)
MONO%: 6.6 % (ref 0.0–14.0)
NEUT%: 88.8 % — ABNORMAL HIGH (ref 38.4–76.8)
NEUTROS ABS: 35.8 10*3/uL — AB (ref 1.5–6.5)
NRBC: 0 % (ref 0–0)
Platelets: 480 10*3/uL — ABNORMAL HIGH (ref 145–400)
RBC: 4.05 10*6/uL (ref 3.70–5.45)
RDW: 27.3 % — AB (ref 11.2–14.5)
RETIC CT ABS: 17.01 10*3/uL — AB (ref 33.70–90.70)
Retic %: 0.42 % — ABNORMAL LOW (ref 0.70–2.10)
WBC: 40.3 10*3/uL — AB (ref 3.9–10.3)
lymph#: 1.8 10*3/uL (ref 0.9–3.3)

## 2016-06-18 LAB — COMPREHENSIVE METABOLIC PANEL
ALBUMIN: 2.8 g/dL — AB (ref 3.5–5.0)
ALT: 42 U/L (ref 0–55)
ANION GAP: 11 meq/L (ref 3–11)
AST: 78 U/L — ABNORMAL HIGH (ref 5–34)
Alkaline Phosphatase: 282 U/L — ABNORMAL HIGH (ref 40–150)
BILIRUBIN TOTAL: 0.61 mg/dL (ref 0.20–1.20)
BUN: 25.5 mg/dL (ref 7.0–26.0)
CALCIUM: 9.7 mg/dL (ref 8.4–10.4)
CO2: 22 mEq/L (ref 22–29)
Chloride: 110 mEq/L — ABNORMAL HIGH (ref 98–109)
Creatinine: 1.1 mg/dL (ref 0.6–1.1)
EGFR: 51 mL/min/{1.73_m2} — AB (ref >90)
Glucose: 101 mg/dl (ref 70–140)
POTASSIUM: 4.6 meq/L (ref 3.5–5.1)
SODIUM: 142 meq/L (ref 136–145)
Total Protein: 7.8 g/dL (ref 6.4–8.3)

## 2016-06-18 LAB — TECHNOLOGIST REVIEW

## 2016-06-18 NOTE — Progress Notes (Signed)
[place] OFFICE PROGRESS NOTE  Patient Care Team: Haywood Pao, MD as PCP - General (Internal Medicine) Rosalin Hawking, MD as Consulting Physician (Neurology) Gerarda Fraction, MD as Referring Physician (Ophthalmology)  SUMMARY OF ONCOLOGIC HISTORY:   Breast cancer of upper-outer quadrant of left female breast ([place])   01/11/2007 Pathology Results    BREAST, LEFT, NEEDLE BIOPSY, UPPER OUTER QUADRANT MASS: - INVASIVE DUCTAL CARCINOMA, 0.3 CM - COMPLEX HIGH GRADE DUCTAL CARCINOMA IN SITU (DCIS),ASSOCIATED WITH NECROSIS AND CALCIFICATIONS     03/15/2007 Surgery    LEFT BREAST, NEEDLE LOCALIZED LUMPECTOMY: INVASIVE DUCTAL CARCINOMA, 0.3 CM. TUMOR LESS THAN 0.1 CM FROM THE INFERIOR MARGIN. HIGH GRADE DUCTAL CARCINOMA IN SITU.      04/12/2007 - 04/12/2012 Anti-estrogen oral therapy    She completed 5 years of Arimidex     05/08/2016 Imaging    Mammogram show no evidence of malignancy     06/09/2016 Imaging    CT chest showed no evidence for pulmonary metastases.2. Numerous liver lesions identified and compatible with metastatic cancer to the liver.      INTERVAL HISTORY: Please see below for problem oriented charting. She returns to follow-up on test results of liver biopsy. Result came back:OLOGY FINAL DIAGNOSIS Diagnosis Liver, needle/core biopsy - POSITIVE FOR POORLY DIFFERENTIATED ADENOCARCINOMA  She has poor appetite. She complained of fatigue. The patient denies any recent signs or symptoms of bleeding such as spontaneous epistaxis, hematuria or hematochezia.   REVIEW OF SYSTEMS:   Constitutional: Denies fevers, chills Eyes: Denies blurriness of vision Ears, nose, mouth, throat, and face: Denies mucositis or sore throat Respiratory: Denies cough, dyspnea or wheezes Cardiovascular: Denies palpitation, chest discomfort or lower extremity swelling Gastrointestinal:  Denies nausea, heartburn or change in bowel habits Skin: Denies abnormal skin rashes Lymphatics:  Denies new lymphadenopathy or easy bruising Neurological:Denies numbness, tingling or new weaknesses Behavioral/Psych: Mood is stable, no new changes  All other systems were reviewed with the patient and are negative.  I have reviewed the past medical history, past surgical history, social history and family history with the patient and they are unchanged from previous note.  ALLERGIES:  has No Known Allergies.  MEDICATIONS:  Current Outpatient Prescriptions  Medication Sig Dispense Refill  . Cholecalciferol (VITAMIN D3 PO) Take 1,000 Units by mouth.    . felodipine (PLENDIL) 10 MG 24 hr tablet Take 10 mg by mouth daily.    . ferrous sulfate 325 (65 FE) MG tablet Take 650 mg by mouth daily with breakfast. TAKES 2 DAILY    . ibandronate (BONIVA) 150 MG tablet Take 150 mg by mouth once a week. Sunday Mornings    . losartan (COZAAR) 25 MG tablet Take 25 mg by mouth daily.    . magic mouthwash w/lidocaine SOLN Take 5 mLs by mouth 4 (four) times daily as needed for mouth pain. 120 mL 0  . metoprolol succinate (TOPROL-XL) 50 MG 24 hr tablet Take 50 mg by mouth daily.     . potassium chloride SA (K-DUR,KLOR-CON) 20 MEQ tablet Take 20 mEq by mouth daily.     . pravastatin (PRAVACHOL) 40 MG tablet Take 40 mg by mouth every evening.     Marland Kitchen aspirin EC 81 MG EC tablet Take 1 tablet (81 mg total) by mouth daily. (Patient not taking: Reported on 06/18/2016) 30 tablet 6  . clopidogrel (PLAVIX) 75 MG tablet TAKE 1 TABLET BY MOUTH DAILY (Patient not taking: Reported on 06/18/2016) 90 tablet 0   No current facility-administered  medications for this visit.     PHYSICAL EXAMINATION: ECOG PERFORMANCE STATUS: 2 - Symptomatic, <50% confined to bed  Vitals:   06/18/16 1042  BP: (!) 125/52  Pulse: 81  Resp: 19  Temp: 97.7 F (36.5 C)   Filed Weights   06/18/16 1042  Weight: 101 lb (45.8 kg)    GENERAL:alert, no distress and comfortable. She looks thin and cachectic SKIN: skin color, texture, turgor are  normal, no rashes or significant lesions EYES: normal, Conjunctiva are pale and non-injected, sclera clear Musculoskeletal:no cyanosis of digits and no clubbing  NEURO: alert & oriented x 3 with fluent speech, no focal motor/sensory deficits  LABORATORY DATA:  I have reviewed the data as listed    Component Value Date/Time   NA 142 06/18/2016 1017   K 4.6 06/18/2016 1017   CL 107 12/05/2015 1530   CO2 22 06/18/2016 1017   GLUCOSE 101 06/18/2016 1017   BUN 25.5 06/18/2016 1017   CREATININE 1.1 06/18/2016 1017   CALCIUM 9.7 06/18/2016 1017   PROT 7.8 06/18/2016 1017   ALBUMIN 2.8 (L) 06/18/2016 1017   AST 78 (H) 06/18/2016 1017   ALT 42 06/18/2016 1017   ALKPHOS 282 (H) 06/18/2016 1017   BILITOT 0.61 06/18/2016 1017   GFRNONAA 37 (L) 12/05/2015 1530   GFRAA 43 (L) 12/05/2015 1530    No results found for: SPEP, UPEP  Lab Results  Component Value Date   WBC 40.3 (H) 06/18/2016   NEUTROABS 35.8 (H) 06/18/2016   HGB 9.0 (L) 06/18/2016   HCT 28.7 (L) 06/18/2016   MCV 70.9 (L) 06/18/2016   PLT 480 (H) 06/18/2016      Chemistry      Component Value Date/Time   NA 142 06/18/2016 1017   K 4.6 06/18/2016 1017   CL 107 12/05/2015 1530   CO2 22 06/18/2016 1017   BUN 25.5 06/18/2016 1017   CREATININE 1.1 06/18/2016 1017      Component Value Date/Time   CALCIUM 9.7 06/18/2016 1017   ALKPHOS 282 (H) 06/18/2016 1017   AST 78 (H) 06/18/2016 1017   ALT 42 06/18/2016 1017   BILITOT 0.61 06/18/2016 1017       RADIOGRAPHIC STUDIES:I review multiple imaging study with her and her daughter I have personally reviewed the radiological images as listed and agreed with the findings in the report.    ASSESSMENT & PLAN:  Metastasis to liver of unknown origin [facility]) I had long discussion with the pathologist. With suspect it could be primary hepatobiliary, GI or GYN primary. More than half of her liver is filled with metastatic cancer. The patient is thin and cachectic with  significant outer comorbidities including prior history of stroke. There is no simple systemic chemotherapy option. All treatment options are palliative in nature All treatment options are associated with significant morbidities including increase risk of side effects and complications with significant negative impact on quality of life with just possibly several months of survival benefit. After significant discussion, we are in agreement to enroll her to home-based hospice care.  DNR (do not resuscitate) discussion I have a long discussion with the patient and family members regarding the goals of care. The patient is aware she has stage IV disease and treatment is strictly palliative. We discussed importance of Advanced Directives and Living will. We discussed CODE STATUS; the patient agreed for DO NOT RESUSCITATE  We also discussed potential referral for home base hospice care and she agreed  Iron deficiency anemia The  patient is on chronic antiplatelet agent for history of stroke and has significant anemia. She had received blood transfusion recently. Today she is asymptomatic. Recommend observation only  Leukocytosis Cause is unknown. She does not look clinically septic. She does not need antibiotic therapy It could be related to bone marrow infiltration from metastatic cancer. As we agreed to transition her care to palliative care, I will not initiate further workup such as bone marrow biopsy    No orders of the defined types were placed in this encounter.  All questions were answered. The patient knows to call the clinic with any problems, questions or concerns. No barriers to learning was detected. I spent 30 minutes counseling the patient face to face. The total time spent in the appointment was 40 minutes and more than 50% was on counseling and review of test results     [facility], [place], MD 06/18/2016 5:28 PM

## 2016-06-18 NOTE — Assessment & Plan Note (Signed)
I had long discussion with the pathologist. With suspect it could be primary hepatobiliary, GI or GYN primary. More than half of her liver is filled with metastatic cancer. The patient is thin and cachectic with significant outer comorbidities including prior history of stroke. There is no simple systemic chemotherapy option. All treatment options are palliative in nature All treatment options are associated with significant morbidities including increase risk of side effects and complications with significant negative impact on quality of life with just possibly several months of survival benefit. After significant discussion, we are in agreement to enroll her to home-based hospice care.

## 2016-06-18 NOTE — Assessment & Plan Note (Signed)
Cause is unknown. She does not look clinically septic. She does not need antibiotic therapy It could be related to bone marrow infiltration from metastatic cancer. As we agreed to transition her care to palliative care, I will not initiate further workup such as bone marrow biopsy

## 2016-06-18 NOTE — Assessment & Plan Note (Signed)
I have a long discussion with the patient and family members regarding the goals of care. The patient is aware she has stage IV disease and treatment is strictly palliative. We discussed importance of Advanced Directives and Living will. We discussed CODE STATUS; the patient agreed for DO NOT RESUSCITATE  We also discussed potential referral for home base hospice care and she agreed

## 2016-06-18 NOTE — Assessment & Plan Note (Signed)
The patient is on chronic antiplatelet agent for history of stroke and has significant anemia. She had received blood transfusion recently. Today she is asymptomatic. Recommend observation only

## 2016-06-18 NOTE — Telephone Encounter (Signed)
Referral made to [facility] per Dr. Alvy Bimler.   S/w Amy at [facility].   Dr. Alvy Bimler will be the Attending and please enact Hospice orders.   Contact pt's daughter Wyvonnia Dusky to arrange assessment visit at phone [phone].

## 2016-06-19 ENCOUNTER — Telehealth: Payer: Self-pay | Admitting: *Deleted

## 2016-06-19 ENCOUNTER — Telehealth: Payer: Self-pay | Admitting: Hematology and Oncology

## 2016-06-19 NOTE — Telephone Encounter (Signed)
no orders entered

## 2016-06-19 NOTE — Telephone Encounter (Signed)
VM from [place], Newman Nickels.  She reports although pt meets criteria for Hospice Admission,  pt and family were undecided about Hospice admission on visit today.   They want to "weigh their options" and think about things some more.  Family will either call Hospice back to enroll when they are ready or they will call Dr. Calton Dach office to discuss treatment options again.   Pam felt that they will probably call Hospice back but just need more time to think about it.

## 2016-06-25 ENCOUNTER — Other Ambulatory Visit: Payer: Self-pay | Admitting: Hematology and Oncology

## 2016-06-25 ENCOUNTER — Encounter: Payer: Self-pay | Admitting: Hematology and Oncology

## 2016-06-25 ENCOUNTER — Telehealth: Payer: Self-pay

## 2016-06-25 MED ORDER — MIRTAZAPINE 15 MG PO TABS: 15.0000 mg | ORAL_TABLET | Freq: Every day | ORAL | 0 refills | Status: DC

## 2016-06-25 NOTE — Telephone Encounter (Signed)
We can prescribe remeron 15 mg qhs PO for appetite. She really needs hospice She has metastatic cancer that is most likely cause of anorexia.

## 2016-06-25 NOTE — Telephone Encounter (Signed)
[name] phone is [phone]. She will be in [place] until Saturday.

## 2016-06-25 NOTE — Telephone Encounter (Signed)
[name], daughter, called with concern. Pt has no appetite and when she does eat the food tastes bad like grass. Also pt states her tongue is sore when she eats, Fraser Din looked at tongue and stated it was more pink than normal. Will route to barb neff for possible food choices. Is there something we can do for appetite? Or for sore tongue?  We also discussed that pt will need to call us or hospice once she makes a decision. Answered concern that yes she can go back to hospice if she opts for chemo then changes her mind.

## 2016-06-25 NOTE — Addendum Note (Signed)
Addended by: Janace Hoard on: 06/25/2016 02:12 PM   Modules accepted: Orders

## 2016-06-25 NOTE — Telephone Encounter (Signed)
S/w Pat per Dr Alvy Bimler attached message. Also discussed using magic mouthwash for sore tongue per Dr Alvy Bimler. Explained again that the cancer is causing the anorexia and the changes in taste. Dr Alvy Bimler believes hospice is the best for the pt.

## 2016-06-25 NOTE — Progress Notes (Signed)
form left in box 06/20/16

## 2016-06-26 ENCOUNTER — Other Ambulatory Visit: Payer: Self-pay | Admitting: Oncology

## 2016-06-26 ENCOUNTER — Encounter: Payer: Self-pay | Admitting: Hematology and Oncology

## 2016-06-26 NOTE — Progress Notes (Signed)
form left in box 06/20/16- left form for dr Alvy Bimler to sign

## 2016-06-27 ENCOUNTER — Telehealth: Payer: Self-pay | Admitting: Oncology

## 2016-06-27 ENCOUNTER — Encounter: Payer: Self-pay | Admitting: Hematology and Oncology

## 2016-06-27 NOTE — Telephone Encounter (Signed)
Confirmed 8/21 at 5pm with pt's daughter. Scheduled appt.

## 2016-06-27 NOTE — Progress Notes (Signed)
Faxed [phone] and mailed to patient -sent to medical recrds

## 2016-06-29 NOTE — Progress Notes (Signed)
ID: Fontaine No   DOB: 07-21-1931  MR#: [id number]  TKZ#:[id number]   Patient Care Team: Haywood Pao, MD as PCP - General (Internal Medicine) Rosalin Hawking, MD as Consulting Physician (Neurology) Gerarda Fraction, MD as Referring Physician (Ophthalmology)  HISTORY OF BREAST CANCER;: From the original intake note:  The patient has yearly mammograms routinely and mammography in January of 2006 had been unremarkable.  On 12/14/2006, mammography showed new calcifications in the left breast, and the patient was called back for a diagnostic study with ultrasonography on 12/20/2006.  This showed a small lobular mass with calcifications in the 1 o'clock position within the left breast, which by ultrasound measured up to 5 mm in size.  Accordingly, the patient was called back for ultrasound-guided biopsy 01/11/2007 and this showed ([id number] and [id number]) an invasive ductal carcinoma, which was strongly ER positive at 94%, weakly PR positive at 8%, with a borderline proliferation marker at 19% and negative for HER-2 by the HercepTest at 0.   On 01/27/2007, she underwent bilateral breast MRIs.  This showed multiple foci of enhancement bilaterally, but particularly a 5-mm nodule in the upper outer quadrant of the left breast correlated with the mammographic abnormality otherwise noted.  There was no other dominant nodule or suspicious enhancement noted in either breast.    With this information, the patient proceeded to left needle localization lumpectomy and sentinel lymph node biopsy on 03/15/2007.  The final pathology there ([id number]) showed a 3-mm invasive ductal carcinoma, which was grade 2, with no evidence of lymphovascular invasion.  Margins were negative, although close at 1 mm, both for the invasive and the noninvasive components.  Both of the sentinel lymph nodes were clear.  Her subsequent history is as detailed below.  HISTORY OF METASTATIC DISEASE: The patient is status post stroke and is on  antiplatelet agents. She was found to have occasional hematochezia, and was found to have severe anemia. She was referred here for further evaluation after CT scans of the abdomen showed diffuse metastatic disease to the liver. The patient saw Dr. Alvy Bimler on 06/04/2016 and she set up the patient for liver biopsy. This showed ([id number]) poorly differentiated adenocarcinoma which was negative for gross cystic disease fluid protein and for the estrogen receptor. It was positive for CD X2 and cytokeratin 7. It was TTF-1 negative. This pattern is more consistent with a pancreatic, biliary, or upper GI primary. The patient and family met a [place] with Dr. Alvy Bimler 06/26/2016, and at that time she recommended hospice.  INTERVAL HISTORY: Ms. Killingsworth returns today for further discussion of management options in the setting of her stage IV adenocarcinoma involving the liver accompanied by 2 of her granddaughters and great-granddaughter. The patient requested a "second opinion" with me after she was referred to hospice by my partner Dr. Alvy Bimler. She is here today to discuss her second primary cancer  REVIEW OF SYSTEMS: Kaniah complains of decreased appetite. She is having some irritation in her mouth and if she eats anything that has been granted "it burns". Things don't taste right. She was started on Remeron but it causes her nausea and she stopped taking it. She is very fatigued. She tries to do her housework but has to stop halfway, take a break, and then restart. She sleeps okay. She has no pain. She has normal bowel movements. She has lost weight. A detailed review of systems today was otherwise noncontributory  PAST MEDICAL HISTORY: Past Medical History:  Diagnosis Date  . Cancer ([place])  breast  . Cataract   . Hypertension   . Parathyroid disease ([place])    Clarify with patient. No detail indicated on history form.  . Renal insufficiency   . Stroke [facility])     PAST SURGICAL HISTORY: Past Surgical  History:  Procedure Laterality Date  . BREAST LUMPECTOMY  12-08-06   Confirm type with patient. Not indicated on medical history form dated 12/26/10.  Marland Kitchen CAROTID ARTERY ANGIOPLASTY Right    05/11/15  . CAROTID STENT Right 05/11/15  . PARATHYROIDECTOMY  June 2010  . PERIPHERAL VASCULAR CATHETERIZATION Right 04/02/2015   Procedure: Carotid Angiography;  Surgeon: Conrad Pima, MD;  Location: [facility] [place] [facility];  Service: Cardiovascular;  Laterality: Right;  . PERIPHERAL VASCULAR CATHETERIZATION N/A 05/11/2015   Procedure: Carotid PTA/Stent Intervention;  Surgeon: [name], MD;  Location: [facility];  Service: Cardiovascular;  Laterality: N/A;    FAMILY HISTORY Family History  Problem Relation Age of Onset  . Stroke Mother   . Hypertension Mother   . Stroke Father   . Cancer Sister     stomach ca  . Cancer Sister     stomach ca  The patient's father died at the age of 51 from a stroke.  The patient's mother died at the age of 52 from a stroke.  The patient had 77 siblings-3 brothers and 6 sisters.  There is no history of breast cancer in the family.  Two sisters died from "cancer in the stomach", by which the patient actually means the abdomen.  It is not possible to ascertain whether this was ovarian cancer or some other cancer metastatic to the abdominal area.    GYNECOLOGIC HISTORY: The patient is GX P11, first pregnancy age 44, last pregnancy age 71.  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  She is widowed-her husband died in 1990-12-08 from multiple complications of diabetes and high blood pressure.  A grandson, Merleen Nicely, lives behind the house and is frequently with her. Mostly, the patient lives by herself.  She does drive.  She is a member of [employer].   ADVANCED DIRECTIVES: At the  06/30/2016 visit the patient was advised against life support or resuscitation. She and her family appear to be in agreement  HEALTH MAINTENANCE: Social History  Substance Use Topics  . Smoking status: Never Smoker  . Smokeless tobacco: Never Used     Comment: "When using the bathroom"  . Alcohol use 0.6 oz/week    1 Glasses of wine per week     Comment: Glass of wine on holidays.      No Known Allergies  Current Outpatient Prescriptions  Medication Sig Dispense Refill  . Cholecalciferol (VITAMIN D3 PO) Take 1,000 Units by mouth.    . losartan (COZAAR) 25 MG tablet Take 25 mg by mouth daily.    . magic mouthwash w/lidocaine SOLN Take 5 mLs by mouth 4 (four) times daily as needed for mouth pain. 120 mL 0  . metoprolol succinate (TOPROL-XL) 50 MG 24 hr tablet Take 50 mg by mouth daily.     . potassium chloride SA (K-DUR,KLOR-CON) 20 MEQ tablet Take 20 mEq by mouth daily.     Marland Kitchen aspirin EC 81 MG EC tablet Take 1 tablet (81 mg total) by mouth daily. (  Patient not taking: Reported on 06/18/2016) 30 tablet 6  . mirtazapine (REMERON) 15 MG tablet TAKE 1 TABLET BY MOUTH EVERY NIGHT AT BEDTIME FOR APPETITE (Patient not taking: Reported on 06/30/2016) 90 tablet 0  . predniSONE (STERAPRED UNI-PAK 21 TAB) 10 MG (21) TBPK tablet Take 1 tablet (10 mg total) by mouth daily. 60 tablet 4  . prochlorperazine (COMPAZINE) 5 MG tablet Take 1 tablet (5 mg total) by mouth at bedtime as needed for nausea or vomiting. 60 tablet 4   No current facility-administered medications for this visit.     OBJECTIVE: Elderly African American woman Who appears mildly cachectic Vitals:   06/30/16 1704  BP: (!) 120/57  Pulse: 77  Resp: 18  Temp: 98 F (36.7 C)     Body mass index is 17.31 kg/m.    ECOG FS: 1 Filed Weights   06/30/16 1704  Weight: 97 lb 11.2 oz (44.3 kg)   Sclerae unicteric, EOMs intact Oropharynx clear, Slightly dry No cervical or supraclavicular adenopathy Lungs no rales or rhonchi Heart  regular rate and rhythm Abd  nontender including right upper quadrant, positive bowel sounds MSK no focal spinal tenderness, no upper extremity lymphedema Neuro: nonfocal, well oriented, appropriate affect Breasts: Deferred  LAB RESULTS: Lab Results  Component Value Date   WBC 40.3 (H) 06/18/2016   NEUTROABS 35.8 (H) 06/18/2016   HGB 9.0 (L) 06/18/2016   HCT 28.7 (L) 06/18/2016   MCV 70.9 (L) 06/18/2016   PLT 480 (H) 06/18/2016      Chemistry      Component Value Date/Time   NA 142 06/18/2016 1017   K 4.6 06/18/2016 1017   CL 107 12/05/2015 1530   CO2 22 06/18/2016 1017   BUN 25.5 06/18/2016 1017   CREATININE 1.1 06/18/2016 1017      Component Value Date/Time   CALCIUM 9.7 06/18/2016 1017   ALKPHOS 282 (H) 06/18/2016 1017   AST 78 (H) 06/18/2016 1017   ALT 42 06/18/2016 1017   BILITOT 0.61 06/18/2016 1017       Lab Results  Component Value Date   LABCA2 23 01/29/2009    No components found for: XIPJA250  No results for input(s): INR in the last 168 hours.  Urinalysis    Component Value Date/Time   COLORURINE YELLOW 03/06/2015 1643   APPEARANCEUR CLOUDY (A) 03/06/2015 1643   LABSPEC 1.010 03/06/2015 1643   PHURINE 5.5 03/06/2015 1643   GLUCOSEU NEGATIVE 03/06/2015 1643   HGBUR NEGATIVE 03/06/2015 1643   BILIRUBINUR NEGATIVE 03/06/2015 1643   KETONESUR NEGATIVE 03/06/2015 1643   PROTEINUR 30 (A) 03/06/2015 1643   UROBILINOGEN 0.2 03/06/2015 1643   NITRITE NEGATIVE 03/06/2015 1643   LEUKOCYTESUR MODERATE (A) 03/06/2015 1643    STUDIES: Ct Chest Wo Contrast  Result Date: 06/09/2016 CLINICAL DATA:  Metastatic cancer to the liver seen on CT performed at outside imaging location. 20 pound weight loss. EXAM: CT CHEST WITHOUT CONTRAST TECHNIQUE: Multidetector CT imaging of the chest was performed following the standard protocol without IV contrast. COMPARISON:  None. FINDINGS: Cardiovascular: Normal heart size. Aortic atherosclerosis is noted. Calcification  within the RCA, LAD and left circumflex coronary artery noted. Mediastinum/Nodes: The trachea is patent and is midline. Unremarkable appearance of the esophagus. No enlarged mediastinal or hilar lymph nodes. No axillary or supraclavicular adenopathy. Lungs/Pleura: There is no pleural fluid identified. No airspace consolidation or atelectasis. No suspicious pulmonary nodules or masses. Upper Abdomen: Numerous suspicious lesions are identified throughout the liver compatible with the provided  clinical history metastatic disease to the liver. Largest lesion is identified centrally measuring 8.1 cm, image 120 of series 2. The visualized portions of the kidneys and spleen are normal. Enlarged gastrohepatic ligament lymph node Measures 2.2 cm, image 137 of series 2. Musculoskeletal: No aggressive lytic or sclerotic bone lesions identified. IMPRESSION: 1. No evidence for pulmonary metastases. 2. Numerous liver lesions identified and compatible with metastatic cancer to the liver. 3. Pathologically enlarged gastrohepatic ligament lymph node noted within the upper abdomen. Electronically Signed   By: Kerby Moors M.D.   On: 06/09/2016 16:33   US Biopsy  Result Date: 06/16/2016 CLINICAL DATA:  History breast carcinoma. Multiple liver lesions suggesting metastatic disease. EXAM: ULTRASOUND-GUIDED CORE LIVER BIOPSY TECHNIQUE: An ultrasound guided liver biopsy was thoroughly discussed with the patient and questions were answered. The benefits, risks, alternatives, and complications were also discussed. The patient understands and wishes to proceed with the procedure. A verbal as well as written consent was obtained. Survey ultrasound of the liver was performed, a representative lesion localized, and an appropriate skin entry site was determined. Skin site was marked, prepped with Betadine, and draped in usual sterile fashion, and infiltrated locally with 1% lidocaine. Intravenous Fentanyl and Versed were administered as  conscious sedation during continuous monitoring of the patient's level of consciousness and physiological / cardiorespiratory status by the radiology RN, with a total moderate sedation time of 8 minutes. A 17 gauge trocar needle was advanced under ultrasound guidance into the liver to the margin of the lesion. Three coaxial 18gauge core samples were then obtained through the guide needle. The guide needle was removed. Post procedure scans demonstrate no apparent complication. COMPLICATIONS: COMPLICATIONS None immediate FINDINGS: Multiple solid liver low nodules consistent with metastatic disease, corresponding to findings on recent CT. Core biopsy samples of a representative lesion were obtained without complication. IMPRESSION: 1. Technically successful ultrasound guided core liver lesion biopsy. Electronically Signed   By: Lucrezia Europe M.D.   On: 06/16/2016 16:38     ASSESSMENT: 82 y.o.   [place] woman   (1) status post left lumpectomy and sentinel lymph node dissection May 2008 for a T1a N0 grade 2 estrogen and progesterone receptor positive HER-2 negative invasive ductal carcinoma, did not receive  radiation, on Arimidex between June 2008 in June 2013, at which point she completed 5 years of anti-estrogen therapy and was released from follow-up   (2) removal of a parathyroid adenoma under Dr. Harlow Asa, Mar 18, 2011 (pathology [phone])  (3) multiple liver lesions biopsied 06/16/2016, showing poorly differentiated adenocarcinoma, estrogen receptor and gross cystic disease fluid protein negative with an immunohistochemical pattern most consistent with a pancreaticobiliary primary  (3) history of multiple strokes, on chronic antiplatelet agents  PLAN:  We spent approximately 40 minutes going over and ease current situation. She understands the cancer in her liver is not her prior breast cancer. It is also not liver cancer. It is a cancer most likely from bile docked that has spread to her liver and  is taking over her liver.  Whenever the liver is irritated, the patient is going to lose her appetite, have altered taste, and be easily nauseated. She is having early symptoms.  Unfortunately this is not a cancer we not cure. Worse, we do not have good treatment for pancreatic O [place] cancers. In her case, particularly given her age and functional status at present, I think if we tried chemotherapy, which would be the only treatment option available, it would be more likely  to hurt and to help her.  Accordingly I recommended that we do comfort/palliative care in stead. I also recommend hospice involvement and discuss this with the patient and family. They are in agreement. The goal of any intervention is first of all comfort, and second to make sure she is as fit as possible as long as possible  In line without I have cleared some of her chronic medications which are not likely to be of benefit in this situation. I have added prednisone 10 mg in the morning and Compazine 5 mg to take before her Remeron dose in the evening.  I note her elevated white cell count in the absence of any fever or evidence of infection. I believe this is reactive from a from the cancer itself. I'm not starting her on empiric antibiotics on that basis.  I'm hoping with a few simple interventions and with the help of hospice she will be more comfortable and feel more fit. An initial goal is for her to maintain her weight. She will return to see me within a month. If everything is going well at that time I would start seeing her on an every 2 month basis.   MAGRINAT,GUSTAV C    06/30/2016

## 2016-06-30 ENCOUNTER — Ambulatory Visit (HOSPITAL_BASED_OUTPATIENT_CLINIC_OR_DEPARTMENT_OTHER): Payer: Medicare Other | Admitting: Oncology

## 2016-06-30 VITALS — BP 120/57 | HR 77 | Temp 98.0°F | Resp 18 | Ht 63.0 in | Wt 97.7 lb

## 2016-06-30 DIAGNOSIS — C541 Malignant neoplasm of endometrium: Secondary | ICD-10-CM

## 2016-06-30 DIAGNOSIS — C787 Secondary malignant neoplasm of liver and intrahepatic bile duct: Secondary | ICD-10-CM

## 2016-06-30 DIAGNOSIS — C50412 Malignant neoplasm of upper-outer quadrant of left female breast: Secondary | ICD-10-CM | POA: Diagnosis present

## 2016-06-30 DIAGNOSIS — Z170 Estrogen receptor positive status [ER+]: Secondary | ICD-10-CM | POA: Diagnosis not present

## 2016-06-30 DIAGNOSIS — C801 Malignant (primary) neoplasm, unspecified: Secondary | ICD-10-CM

## 2016-06-30 MED ORDER — PREDNISONE 10 MG (21) PO TBPK: 10.0000 mg | ORAL_TABLET | Freq: Every day | ORAL | 4 refills | Status: AC

## 2016-06-30 MED ORDER — MAGIC MOUTHWASH W/LIDOCAINE: 5.0000 mL | Freq: Four times a day (QID) | ORAL | 0 refills | Status: AC | PRN

## 2016-06-30 MED ORDER — PROCHLORPERAZINE MALEATE 5 MG PO TABS: 5.0000 mg | ORAL_TABLET | Freq: Every evening | ORAL | 4 refills | Status: AC | PRN

## 2016-07-01 ENCOUNTER — Telehealth: Payer: Self-pay | Admitting: Oncology

## 2016-07-01 ENCOUNTER — Telehealth: Payer: Self-pay | Admitting: *Deleted

## 2016-07-01 NOTE — Telephone Encounter (Signed)
appt made and letter sent by mail 8/22

## 2016-07-01 NOTE — Telephone Encounter (Signed)
"  I'M CALLING TO PICK UP FMLA LEFT LAST WEEK."  ADVISED FORM MAILED TO PATIENT AND FAXED.  "I'M SUPPOSED TO PICK IT UP."  CALL TRANSFERRED EXT [phone].

## 2016-07-08 ENCOUNTER — Encounter: Payer: Self-pay | Admitting: Oncology

## 2016-07-08 NOTE — Progress Notes (Signed)
forms left in my box--[id number]

## 2016-07-09 ENCOUNTER — Encounter: Payer: Self-pay | Admitting: Oncology

## 2016-07-09 NOTE — Progress Notes (Signed)
2 forms left in box. I left for Dr. Jana Hakim to sign-pam and Brenton Grills

## 2016-07-10 ENCOUNTER — Encounter: Payer: Self-pay | Admitting: Oncology

## 2016-07-10 NOTE — Progress Notes (Signed)
Mailed fmla forms for [name] and [name] to [employer] form. Sent to medical recrds also

## 2016-07-25 ENCOUNTER — Encounter: Payer: Self-pay | Admitting: Family

## 2016-07-30 ENCOUNTER — Other Ambulatory Visit: Payer: Self-pay | Admitting: *Deleted

## 2016-07-30 ENCOUNTER — Ambulatory Visit (HOSPITAL_BASED_OUTPATIENT_CLINIC_OR_DEPARTMENT_OTHER): Payer: Medicare Other | Admitting: Oncology

## 2016-07-30 ENCOUNTER — Other Ambulatory Visit (HOSPITAL_BASED_OUTPATIENT_CLINIC_OR_DEPARTMENT_OTHER)

## 2016-07-30 VITALS — BP 108/49 | HR 92 | Temp 98.5°F | Resp 18 | Ht 63.0 in | Wt 87.9 lb

## 2016-07-30 DIAGNOSIS — D509 Iron deficiency anemia, unspecified: Secondary | ICD-10-CM

## 2016-07-30 DIAGNOSIS — E119 Type 2 diabetes mellitus without complications: Secondary | ICD-10-CM

## 2016-07-30 DIAGNOSIS — Z8509 Personal history of malignant neoplasm of other digestive organs: Secondary | ICD-10-CM | POA: Diagnosis not present

## 2016-07-30 DIAGNOSIS — Z170 Estrogen receptor positive status [ER+]: Secondary | ICD-10-CM | POA: Diagnosis not present

## 2016-07-30 DIAGNOSIS — C50412 Malignant neoplasm of upper-outer quadrant of left female breast: Secondary | ICD-10-CM

## 2016-07-30 DIAGNOSIS — C787 Secondary malignant neoplasm of liver and intrahepatic bile duct: Secondary | ICD-10-CM | POA: Diagnosis not present

## 2016-07-30 DIAGNOSIS — C801 Malignant (primary) neoplasm, unspecified: Secondary | ICD-10-CM

## 2016-07-30 LAB — CBC & DIFF AND RETIC
BASO%: 0.1 % (ref 0.0–2.0)
BASOS ABS: 0 10*3/uL (ref 0.0–0.1)
EOS ABS: 0 10*3/uL (ref 0.0–0.5)
EOS%: 0 % (ref 0.0–7.0)
HCT: 24 % — ABNORMAL LOW (ref 34.8–46.6)
HEMOGLOBIN: 7.2 g/dL — AB (ref 11.6–15.9)
IMMATURE RETIC FRACT: 32.3 % — AB (ref 1.60–10.00)
LYMPH%: 1.6 % — AB (ref 14.0–49.7)
MCH: 20.2 pg — ABNORMAL LOW (ref 25.1–34.0)
MCHC: 30 g/dL — ABNORMAL LOW (ref 31.5–36.0)
MCV: 67.4 fL — AB (ref 79.5–101.0)
MONO#: 1.7 10*3/uL — AB (ref 0.1–0.9)
MONO%: 3.5 % (ref 0.0–14.0)
NEUT%: 94.8 % — AB (ref 38.4–76.8)
NEUTROS ABS: 46 10*3/uL — AB (ref 1.5–6.5)
RBC: 3.56 10*6/uL — AB (ref 3.70–5.45)
RDW: 29 % — ABNORMAL HIGH (ref 11.2–14.5)
RETIC CT ABS: 19.58 10*3/uL — AB (ref 33.70–90.70)
Retic %: 0.55 % — ABNORMAL LOW (ref 0.70–2.10)
WBC: 48.6 10*3/uL — AB (ref 3.9–10.3)
lymph#: 0.8 10*3/uL — ABNORMAL LOW (ref 0.9–3.3)
nRBC: 0 % (ref 0–0)

## 2016-07-30 LAB — TECHNOLOGIST REVIEW

## 2016-07-30 MED ORDER — PROMETHAZINE HCL 25 MG RE SUPP: 25.0000 mg | Freq: Four times a day (QID) | RECTAL | 0 refills | Status: AC | PRN

## 2016-07-30 MED ORDER — HYDROCORTISONE ACETATE 25 MG RE SUPP: 25.0000 mg | Freq: Two times a day (BID) | RECTAL | 0 refills | Status: AC

## 2016-07-30 NOTE — Progress Notes (Signed)
ID: Fontaine No   DOB: 1931-11-10  MR#: [id number]  QAE#:[id number]   Patient Care Team: Haywood Pao, MD as PCP - General (Internal Medicine) Rosalin Hawking, MD as Consulting Physician (Neurology) Gerarda Fraction, MD as Referring Physician (Ophthalmology)  HISTORY OF BREAST CANCER;: From the original intake note:  The patient has yearly mammograms routinely and mammography in January of 2006 had been unremarkable.  On 12/14/2006, mammography showed new calcifications in the left breast, and the patient was called back for a diagnostic study with ultrasonography on 12/20/2006.  This showed a small lobular mass with calcifications in the 1 o'clock position within the left breast, which by ultrasound measured up to 5 mm in size.  Accordingly, the patient was called back for ultrasound-guided biopsy 01/11/2007 and this showed ([id number] and [id number]) an invasive ductal carcinoma, which was strongly ER positive at 94%, weakly PR positive at 8%, with a borderline proliferation marker at 19% and negative for HER-2 by the HercepTest at 0.   On 01/27/2007, she underwent bilateral breast MRIs.  This showed multiple foci of enhancement bilaterally, but particularly a 5-mm nodule in the upper outer quadrant of the left breast correlated with the mammographic abnormality otherwise noted.  There was no other dominant nodule or suspicious enhancement noted in either breast.    With this information, the patient proceeded to left needle localization lumpectomy and sentinel lymph node biopsy on 03/15/2007.  The final pathology there ([id number]) showed a 3-mm invasive ductal carcinoma, which was grade 2, with no evidence of lymphovascular invasion.  Margins were negative, although close so now there are some 15 minutes interviewing studies not is concerned never used by new patient appointment for follow-up with long follow-ups used by friday dose and when the ribs, he has a lot of clots is grossly we'll she is mm, both  for the invasive and the noninvasive components.  Both of the sentinel lymph nodes were clear.  Her subsequent history is as detailed below.  HISTORY OF METASTATIC DISEASE: The patient is status post stroke and is on antiplatelet agents. She was found to have occasional hematochezia, and was found to have severe anemia. She was referred here for further evaluation after CT scans of the abdomen showed diffuse metastatic disease to the liver. The patient saw Dr. Alvy Bimler on 06/04/2016 and she set up the patient for liver biopsy. This showed ([phone]) poorly differentiated adenocarcinoma which was negative for gross cystic disease fluid protein and for the estrogen receptor. It was positive for CD X2 and cytokeratin 7. It was TTF-1 negative. This pattern is more consistent with a pancreatic, biliary, or upper GI primary. The patient and family met a [place] with Dr. Alvy Bimler 06/26/2016, and at that time she recommended hospice.  INTERVAL HISTORY: Anaiah returns today for  follow-upof her stage IV adenocarcinoma involving the liver, accompanied by 3 of her daughters, including one visiting from [place].and he is currently under the care of hospice. She tells me they usually visit on Tuesdays. She appreciates their help.    REVIEW OF SYSTEMS: Carmaleta "sleeps all day", but sits in the living room part of the time. She says it is hard to eat because she has no appetite. The food tastes okay and she has found that any, baby food goes down just fine. She also likes milk, juices, and ice cream. In fact she thinks she is drinking "a lot" although her family does not think she is drinking a quart per day. She has had no  nausea but she did vomit 1 yesterday, she does not recall what brought that on. Aside from this severe fatigue, she is having pain in the stomach area. She is not using much pain medicine at all although she has it available she tries to take one or at most 2 oxycodone a day. She says this is not  constipating her. She does have a little bit of sinus problems, and ankle swelling, and chapped lips. She says her balance is poor but she does not like using her walker at home. She denies falls. She has problems with hemorrhoids. She has not had any fever or bleeding problems. She denies palpitations, chest pain or pressure, or pleurisy. She denies unusual headaches or visual changes. There has been no fever, rash, cough, pleurisy, or worsening shortness of breath. A detailed review of systems today was otherwise stable.   PAST MEDICAL HISTORY: Past Medical History:  Diagnosis Date  . Cancer ([place])    breast  . Cataract   . Hypertension   . Parathyroid disease ([place])    Clarify with patient. No detail indicated on history form.  . Renal insufficiency   . Stroke [facility])     PAST SURGICAL HISTORY: Past Surgical History:  Procedure Laterality Date  . BREAST LUMPECTOMY  December 29, 2006   Confirm type with patient. Not indicated on medical history form dated 12/26/10.  Marland Kitchen CAROTID ARTERY ANGIOPLASTY Right    05/11/15  . CAROTID STENT Right 05/11/15  . PARATHYROIDECTOMY  June 2010  . PERIPHERAL VASCULAR CATHETERIZATION Right 04/02/2015   Procedure: Carotid Angiography;  Surgeon: Conrad Placedo, MD;  Location: [place] [facility];  Service: Cardiovascular;  Laterality: Right;  . PERIPHERAL VASCULAR CATHETERIZATION N/A 05/11/2015   Procedure: Carotid PTA/Stent Intervention;  Surgeon: [name], MD;  Location: [place] [facility];  Service: Cardiovascular;  Laterality: N/A;    FAMILY HISTORY Family History  Problem Relation Age of Onset  . Stroke Mother   . Hypertension Mother   . Stroke Father   . Cancer Sister     stomach ca  . Cancer Sister     stomach ca  The patient's father died at the age of 4 from a stroke.  The patient's mother died at the age of 32 from a stroke.  The patient had 2 siblings-3 brothers and 6 sisters.  There is no history of breast cancer in the family.  Two sisters died from  "cancer in the stomach", by which the patient actually means the abdomen.  It is not possible to ascertain whether this was ovarian cancer or some other cancer metastatic to the abdominal area.    GYNECOLOGIC HISTORY: The patient is GX P11, first pregnancy age 51, last pregnancy age 32.  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  She is widowed-her husband died in 12-29-90 from multiple complications of diabetes and high blood pressure.  A grandson, Merleen Nicely, lives behind the house and is frequently with her. Mostly, the patient lives by herself.  She does drive.  She is a member of [employer].   ADVANCED DIRECTIVES: At the 06/30/2016 visit the patient was advised against life support or resuscitation. She and her family appeared to be in agreement  HEALTH MAINTENANCE: Social History  Substance Use Topics  . Smoking  status: Never Smoker  . Smokeless tobacco: Never Used     Comment: "When using the bathroom"  . Alcohol use 0.6 oz/week    1 Glasses of wine per week     Comment: Glass of wine on holidays.      No Known Allergies  Current Outpatient Prescriptions  Medication Sig Dispense Refill  . aspirin EC 81 MG EC tablet Take 1 tablet (81 mg total) by mouth daily. (Patient not taking: Reported on 06/18/2016) 30 tablet 6  . Cholecalciferol (VITAMIN D3 PO) Take 1,000 Units by mouth.    . losartan (COZAAR) 25 MG tablet Take 25 mg by mouth daily.    . magic mouthwash w/lidocaine SOLN Take 5 mLs by mouth 4 (four) times daily as needed for mouth pain. 120 mL 0  . metoprolol succinate (TOPROL-XL) 50 MG 24 hr tablet Take 50 mg by mouth daily.     . mirtazapine (REMERON) 15 MG tablet TAKE 1 TABLET BY MOUTH EVERY NIGHT AT BEDTIME FOR APPETITE (Patient not taking: Reported on 06/30/2016) 90 tablet 0  .  potassium chloride SA (K-DUR,KLOR-CON) 20 MEQ tablet Take 20 mEq by mouth daily.     . predniSONE (STERAPRED UNI-PAK 21 TAB) 10 MG (21) TBPK tablet Take 1 tablet (10 mg total) by mouth daily. 60 tablet 4  . prochlorperazine (COMPAZINE) 5 MG tablet Take 1 tablet (5 mg total) by mouth at bedtime as needed for nausea or vomiting. 60 tablet 4   No current facility-administered medications for this visit.     OBJECTIVE: Elderly African American woman examined in a wheelchair Vitals:   07/30/16 1525  BP: (!) 108/49  Pulse: 92  Resp: 18  Temp: 98.5 F (36.9 C)     Body mass index is 15.57 kg/m.    ECOG FS: 3 Filed Weights   07/30/16 1525  Weight: 87 lb 14.4 oz (39.9 kg)   Sclerae unicteric, EOMs intact Oropharynx clear, slightly dry No cervical or supraclavicular adenopathy Lungs no rales or rhonchi Heart regular rate and rhythm Abd soft, nontender, positive bowel sounds MSK no focal spinal tenderness, no upper extremity lymphedema Neuro: nonfocal, well oriented, pleasant affect Breasts: Deferred    LAB RESULTS: Lab Results  Component Value Date   WBC 48.6 (H) 07/30/2016   NEUTROABS 46.0 (H) 07/30/2016   HGB 7.2 (L) 07/30/2016   HCT 24.0 (L) 07/30/2016   MCV 67.4 (L) 07/30/2016   PLT 403 Large platelets present (H) 07/30/2016      Chemistry      Component Value Date/Time   NA 142 06/18/2016 1017   K 4.6 06/18/2016 1017   CL 107 12/05/2015 1530   CO2 22 06/18/2016 1017   BUN 25.5 06/18/2016 1017   CREATININE 1.1 06/18/2016 1017      Component Value Date/Time   CALCIUM 9.7 06/18/2016 1017   ALKPHOS 282 (H) 06/18/2016 1017   AST 78 (H) 06/18/2016 1017   ALT 42 06/18/2016 1017   BILITOT 0.61 06/18/2016 1017       Lab Results  Component Value Date   LABCA2 23 01/29/2009    No components found for: [id number]  No results for input(s): INR in the last 168 hours.  Urinalysis    Component Value Date/Time   COLORURINE YELLOW 03/06/2015 1643   APPEARANCEUR  CLOUDY (A) 03/06/2015 1643   LABSPEC 1.010 03/06/2015 1643   PHURINE 5.5 03/06/2015 1643   GLUCOSEU NEGATIVE 03/06/2015 1643   HGBUR NEGATIVE 03/06/2015 [place] 03/06/2015  [place] 03/06/2015 1643   PROTEINUR 30 (A) 03/06/2015 1643   UROBILINOGEN 0.2 03/06/2015 1643   NITRITE NEGATIVE 03/06/2015 1643   LEUKOCYTESUR MODERATE (A) 03/06/2015 1643    STUDIES: No results found.   ASSESSMENT: 82 y.o.   [place] woman   (1) status post left breast upper outer quadrant lumpectomy and sentinel lymph node dissection May 2008 for a T1a N0 grade 2 estrogen and progesterone receptor positive HER-2 negative invasive ductal carcinoma, did not receive  radiation, on Arimidex between June 2008 in June 2013, at which point she completed 5 years of anti-estrogen therapy and was released from follow-up   (2) removal of a parathyroid adenoma under Dr. Harlow Asa, Mar 18, 2011 (pathology [phone])  (3) multiple liver lesions biopsied 06/16/2016, showing poorly differentiated adenocarcinoma, estrogen receptor and gross cystic disease fluid protein negative with an immunohistochemical pattern most consistent with a pancreaticobiliary primary  (3) history of multiple strokes, on chronic antiplatelet agents  PLAN:  Noma is declining now more rapidly but in many ways she is doing better than expected. In particular her pain is very well-controlled currently with minimal use of narcotics and she is not constipated.  We focused on hydration and nourishment issues which are once the family can be most helpful and most engaged with. They understand the importance of many small meals, and we discussed the goal of her drinking or eating up to 2 quarts of liquid per day, which would be optimal for her.  I wrote her a prescription for Phenergan suppositories in case she does start to vomit and cannot take anti-emetics. I also wrote her for an Anusol suppository because of her  hemorrhoids.  Her complete blood count shows what would be expected at this point. Before meals met was obtained with difficulty and her potassium may be elevated. She did not want to have a repeat draw. We have asked her to stop her potassium supplementations.  We discussed the fact that she is at high fall risk and it will be important for her to use a walker and ideally have someone with her when she gets out of bed to go to the living room. They understand very soon she will be likely bedbound.  I have made her a return appointment here in a few weeks. They will call if they are unable to make that appointment.   MAGRINAT,GUSTAV C    07/30/2016

## 2016-07-31 ENCOUNTER — Ambulatory Visit (HOSPITAL_COMMUNITY)
Admission: RE | Admit: 2016-07-31 | Discharge: 2016-07-31 | Disposition: A | Discharge status: Home / Self Care | Source: Ambulatory Visit | Attending: Vascular Surgery | Admitting: Vascular Surgery

## 2016-07-31 ENCOUNTER — Encounter: Payer: Self-pay | Admitting: Family

## 2016-07-31 ENCOUNTER — Ambulatory Visit (INDEPENDENT_AMBULATORY_CARE_PROVIDER_SITE_OTHER): Admitting: Family

## 2016-07-31 ENCOUNTER — Telehealth: Payer: Self-pay | Admitting: *Deleted

## 2016-07-31 VITALS — BP 109/66 | HR 99 | Temp 97.8°F | Resp 16 | Ht 63.0 in | Wt 85.5 lb

## 2016-07-31 DIAGNOSIS — I6523 Occlusion and stenosis of bilateral carotid arteries: Secondary | ICD-10-CM | POA: Diagnosis not present

## 2016-07-31 DIAGNOSIS — Z959 Presence of cardiac and vascular implant and graft, unspecified: Secondary | ICD-10-CM | POA: Insufficient documentation

## 2016-07-31 DIAGNOSIS — I6522 Occlusion and stenosis of left carotid artery: Secondary | ICD-10-CM | POA: Insufficient documentation

## 2016-07-31 DIAGNOSIS — Z8673 Personal history of transient ischemic attack (TIA), and cerebral infarction without residual deficits: Secondary | ICD-10-CM | POA: Diagnosis not present

## 2016-07-31 DIAGNOSIS — Z48812 Encounter for surgical aftercare following surgery on the circulatory system: Secondary | ICD-10-CM | POA: Insufficient documentation

## 2016-07-31 LAB — VAS US CAROTID
LEFT ECA DIAS: -7 cm/s
LEFT VERTEBRAL DIAS: 15 cm/s
LICAPSYS: 93 cm/s
Left CCA dist sys: -63 cm/s
Left CCA prox sys: 66 cm/s
Left ICA dist dias: -29 cm/s
Left ICA dist sys: -88 cm/s
Left ICA prox dias: 29 cm/s
RCCADSYS: -105 cm/s
RIGHT VERTEBRAL DIAS: -9 cm/s
Right CCA prox sys: 56 cm/s

## 2016-07-31 NOTE — Progress Notes (Signed)
Chief Complaint: Follow up Extracranial Carotid Artery Stenosis   History of Present Illness  [name] is a 82 y.o. female who is s/p right carotid angiogram and right carotid angioplasty and stenting on 05/11/15 by Dr. Oneida Alar. She returns today for follow up.    In April 2016 she had a stroke as manifested by left arm tingling, feeling heavy, denies speech difficulties, denies monocular loss of vision; was evaluated at [facility].  She denies any subsequent stroke or TIA.  She rarely walks, is becoming more weak. She denies food fear, denies post prandial abdominal pain.  Her daughter with her states she has liver cancer, diagnosed July 2017.  She is anemic, had an iron transfusion.  She has a hx of breast cancer.   Pt Diabetic: no Pt smoker: non-smoker. She dipped snuff when she was a teen.  Pt meds include: Statin : no ASA: yes Other anticoagulants/antiplatelets: no   Past Medical History:  Diagnosis Date  . Cancer ([place])    breast  . Cataract   . Hypertension   . Parathyroid disease ([place])    Clarify with patient. No detail indicated on history form.  . Renal insufficiency   . Stroke [facility])     Social History Social History  Substance Use Topics  . Smoking status: Never Smoker  . Smokeless tobacco: Never Used     Comment: "When using the bathroom"  . Alcohol use 0.6 oz/week    1 Glasses of wine per week     Comment: Glass of wine on holidays.    Family History Family History  Problem Relation Age of Onset  . Stroke Mother   . Hypertension Mother   . Stroke Father   . Cancer Sister     stomach ca  . Cancer Sister     stomach ca    Surgical History Past Surgical History:  Procedure Laterality Date  . BREAST LUMPECTOMY  2008   Confirm type with patient. Not indicated on medical history form dated 12/26/10.  Marland Kitchen CAROTID ARTERY ANGIOPLASTY Right    05/11/15  . CAROTID STENT Right 05/11/15  . PARATHYROIDECTOMY  June 2010  . PERIPHERAL VASCULAR  CATHETERIZATION Right 04/02/2015   Procedure: Carotid Angiography;  Surgeon: Conrad St. Joseph, MD;  Location: [place] [facility];  Service: Cardiovascular;  Laterality: Right;  . PERIPHERAL VASCULAR CATHETERIZATION N/A 05/11/2015   Procedure: Carotid PTA/Stent Intervention;  Surgeon: [name], MD;  Location: [place] [facility];  Service: Cardiovascular;  Laterality: N/A;    No Known Allergies  Current Outpatient Prescriptions  Medication Sig Dispense Refill  . aspirin EC 81 MG tablet Take 81 mg by mouth daily.    . Cholecalciferol (VITAMIN D3 PO) Take 1,000 Units by mouth.    . hydrocortisone (ANUSOL-HC) 25 MG suppository Place 1 suppository (25 mg total) rectally 2 (two) times daily. 12 suppository 0  . losartan (COZAAR) 25 MG tablet Take 25 mg by mouth daily.    . magic mouthwash w/lidocaine SOLN Take 5 mLs by mouth 4 (four) times daily as needed for mouth pain. 120 mL 0  . predniSONE (STERAPRED UNI-PAK 21 TAB) 10 MG (21) TBPK tablet Take 1 tablet (10 mg total) by mouth daily. 60 tablet 4  . prochlorperazine (COMPAZINE) 5 MG tablet Take 1 tablet (5 mg total) by mouth at bedtime as needed for nausea or vomiting. 60 tablet 4  . promethazine (PHENERGAN) 25 MG suppository Place 1 suppository (25 mg total) rectally every 6 (six)  hours as needed for nausea or vomiting. 12 each 0  . mirtazapine (REMERON) 15 MG tablet TAKE 1 TABLET BY MOUTH EVERY NIGHT AT BEDTIME FOR APPETITE (Patient not taking: Reported on 07/31/2016) 90 tablet 0   No current facility-administered medications for this visit.     Review of Systems : See HPI for pertinent positives and negatives.  Physical Examination  Vitals:   07/31/16 1158 07/31/16 1159  BP: 109/62 109/66  Pulse: 99   Resp: 16   Temp: 97.8 F (36.6 C)   TempSrc: Oral   Weight: 85 lb 8 oz (38.8 kg)   Height: 5\' 3"  (1.6 m)    Body mass index is 15.15 kg/m.  General: WDWN cachectic female in NAD GAIT: normal Eyes: PERRLA Pulmonary: Respirations  are non-labored, CTAB, no rales, rhonchi, or wheezing.  Cardiac: regular rhythm and rate, no detected murmur.  VASCULAR EXAM Carotid Bruits Right Left   Negative Negative    Aorta is mildly palpable. Radial pulses are 2+ palpable and equal.                                                                                                                                          LE Pulses Right Left       FEMORAL  2+ palpable  2+ palpable       POPLITEAL  not palpable  not palpable       POSTERIOR TIBIAL  not palpable  not palpable       DORSALIS PEDIS      ANTERIOR TIBIAL 2+ palpable 1+ palpable     Gastrointestinal: soft, nontender, BS WNL, no r/g,  no palpable masses, liver is enlarged.  Musculoskeletal: Generalized mild muscle atrophy/wasting. M/S 3/5 throughout, extremities without ischemic changes.  Neurologic: A&O X 3; Appropriate Affect, Speech is normal CN 2-12 intact, pain and light touch intact in extremities, Motor exam as listed above.    Assessment: [name] is a 82 y.o. female who is s/p right carotid angiogram and right carotid angioplasty and stenting on 05/11/15. She had a stroke in April 2016 with right arm weakness that has mostly resolved; she has had no subsequent stroke or TIA. She is 85 pounds today, was 108 pounds at her 01/24/16 visit; was diagnosed with liver cancer in July 2017, her appetite has diminished.   DATA Today's carotid duplex suggests a widely patent right internal carotid artery stent without evidence of restenosis or hyperplasia.  Right vertebral artery is antegrade  Left internal carotid artery stenosis with PSV consistent with a 1-39% diameter reduction. No significant change compared to the exam on 01/24/16.   Pt states she was given about 9 months to live, is in Hospice care with a diagnosis of liver cancer; since this is the case, and her carotid  artery stenosis is unchanged, she no longer needs to follow up with [place] unless  her PCP or oncologist thinks she should, or if she is able to get to our office in a year.    Plan: Follow-up in 1 year with Carotid Duplex scan, if pt is doing well enough to return to our office in a year.    I discussed in depth with the patient the nature of atherosclerosis, and emphasized the importance of maximal medical management including strict control of blood pressure, blood glucose, and lipid levels, obtaining regular exercise, and continued cessation of smoking.  The patient is aware that without maximal medical management the underlying atherosclerotic disease process will progress, limiting the benefit of any interventions. The patient was given information about stroke prevention and what symptoms should prompt the patient to seek immediate medical care. Thank you for allowing [place] to participate in this patient's care.  Clemon Chambers, RN, MSN, FNP-C [facility] Office: [phone]  Clinic Physician: Oneida Alar  07/31/16 12:15 PM

## 2016-07-31 NOTE — Patient Instructions (Signed)
Stroke Prevention Some medical conditions and behaviors are associated with an increased chance of having a stroke. You may prevent a stroke by making healthy choices and managing medical conditions. HOW CAN I REDUCE MY RISK OF HAVING A STROKE?   Stay physically active. Get at least 30 minutes of activity on most or all days.  Do not smoke. It may also be helpful to avoid exposure to secondhand smoke.  Limit alcohol use. Moderate alcohol use is considered to be:  No more than 2 drinks per day for men.  No more than 1 drink per day for nonpregnant women.  Eat healthy foods. This involves:  Eating 5 or more servings of fruits and vegetables a day.  Making dietary changes that address high blood pressure (hypertension), high cholesterol, diabetes, or obesity.  Manage your cholesterol levels.  Making food choices that are high in fiber and low in saturated fat, trans fat, and cholesterol may control cholesterol levels.  Take any prescribed medicines to control cholesterol as directed by your health care provider.  Manage your diabetes.  Controlling your carbohydrate and sugar intake is recommended to manage diabetes.  Take any prescribed medicines to control diabetes as directed by your health care provider.  Control your hypertension.  Making food choices that are low in salt (sodium), saturated fat, trans fat, and cholesterol is recommended to manage hypertension.  Ask your health care provider if you need treatment to lower your blood pressure. Take any prescribed medicines to control hypertension as directed by your health care provider.  If you are 18-39 years of age, have your blood pressure checked every 3-5 years. If you are 40 years of age or older, have your blood pressure checked every year.  Maintain a healthy weight.  Reducing calorie intake and making food choices that are low in sodium, saturated fat, trans fat, and cholesterol are recommended to manage  weight.  Stop drug abuse.  Avoid taking birth control pills.  Talk to your health care provider about the risks of taking birth control pills if you are over 35 years old, smoke, get migraines, or have ever had a blood clot.  Get evaluated for sleep disorders (sleep apnea).  Talk to your health care provider about getting a sleep evaluation if you snore a lot or have excessive sleepiness.  Take medicines only as directed by your health care provider.  For some people, aspirin or blood thinners (anticoagulants) are helpful in reducing the risk of forming abnormal blood clots that can lead to stroke. If you have the irregular heart rhythm of atrial fibrillation, you should be on a blood thinner unless there is a good reason you cannot take them.  Understand all your medicine instructions.  Make sure that other conditions (such as anemia or atherosclerosis) are addressed. SEEK IMMEDIATE MEDICAL CARE IF:   You have sudden weakness or numbness of the face, arm, or leg, especially on one side of the body.  Your face or eyelid droops to one side.  You have sudden confusion.  You have trouble speaking (aphasia) or understanding.  You have sudden trouble seeing in one or both eyes.  You have sudden trouble walking.  You have dizziness.  You have a loss of balance or coordination.  You have a sudden, severe headache with no known cause.  You have new chest pain or an irregular heartbeat. Any of these symptoms may represent a serious problem that is an emergency. Do not wait to see if the symptoms will   go away. Get medical help at once. Call your local emergency services (911 in [place] Do not drive yourself to the hospital.   This information is not intended to replace advice given to you by your health care provider. Make sure you discuss any questions you have with your health care provider.   Document Released: 12/04/2004 Document Revised: 11/17/2014 Document Reviewed:  04/29/2013 Elsevier Interactive Patient Education 2016 Elsevier Inc.  

## 2016-07-31 NOTE — Telephone Encounter (Signed)
PA sent to [facility] for suppositories prescribed 9/20.

## 2016-08-08 ENCOUNTER — Telehealth: Payer: Self-pay | Admitting: *Deleted

## 2016-08-08 NOTE — Telephone Encounter (Signed)
This RN received message from pt's daughter- Lilli Light stating she needs assistance with obtaining personal care services - which hospice does not cover.  Return call number given as [phone].  This RN returned call and obtained number idenitifed VM- message left stating this message will be forwarded to our support services - social workers for assistance.  This RN did leave a name and number for contact relating to a PCS - which was used by another patient.

## 2016-08-11 ENCOUNTER — Other Ambulatory Visit: Payer: Self-pay | Admitting: *Deleted

## 2016-08-11 ENCOUNTER — Encounter: Payer: Self-pay | Admitting: *Deleted

## 2016-08-11 NOTE — Progress Notes (Signed)
[place] Work  Holiday representative spoke with patients daughter who reported her mother was declining and unable to preform her ADL's in the same capacity as she did before.  Patient is currently under hospice care.  Family was encouraged by hospice to pursue PCS services.  CSW provided PCS form to RN to complete Medical information.  RN will complete and submit.  CSW informed patients daughter, and PCS will contact family.  CSW encouraged patients daughter to call with questions or concerns.  Johnnye Lana, MSW, LCSW, OSW-C Clinical Social Worker [facility] [phone]

## 2016-08-18 ENCOUNTER — Other Ambulatory Visit: Payer: Self-pay | Admitting: Oncology

## 2016-09-10 DEATH — deceased

## 2016-09-11 ENCOUNTER — Ambulatory Visit: Payer: Medicare Other | Admitting: Oncology

## 2016-12-17 ENCOUNTER — Encounter: Payer: Self-pay | Admitting: Cardiology

## 2017-04-24 ENCOUNTER — Ambulatory Visit: Payer: Medicare Other | Admitting: Neurology

## 2018-02-01 IMAGING — CT CT OUTSIDE FILMS BODY
3 of 5 series · 15 of 46 positions shown, 20 images · non-contrast
Comparison: none

[Series 2: abd/pelvis with · axial · 0.72mm/px · z∈[-433,-78]mm · 11 of 162 slices shown, 16 images]
[im 10/162  soft-tissue]
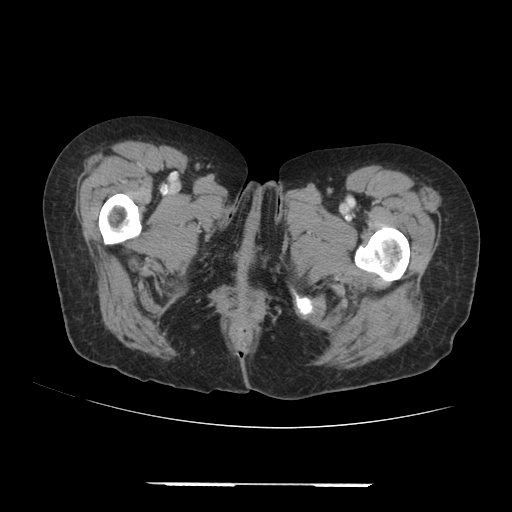
[im 10/162  bone]
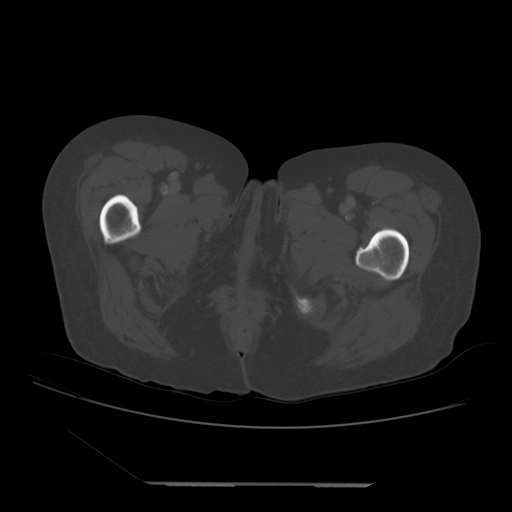
[im 29/162  soft-tissue]
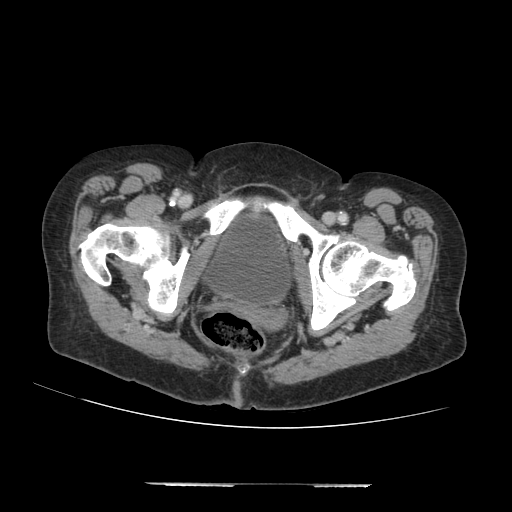
[im 48/162  soft-tissue]
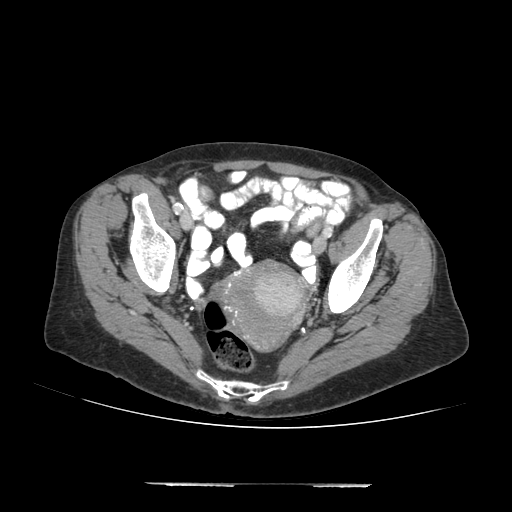
[im 57/162  soft-tissue]
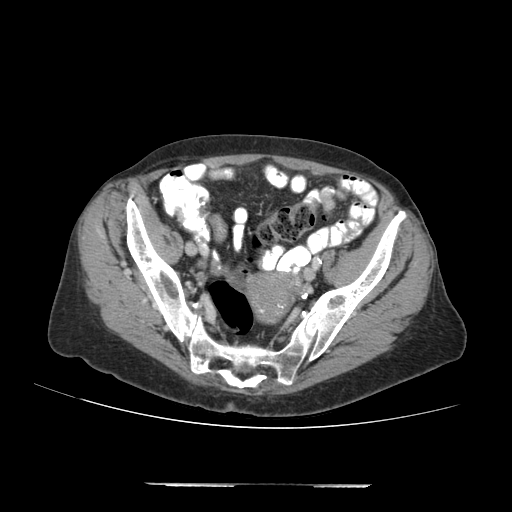
[im 76/162  soft-tissue]
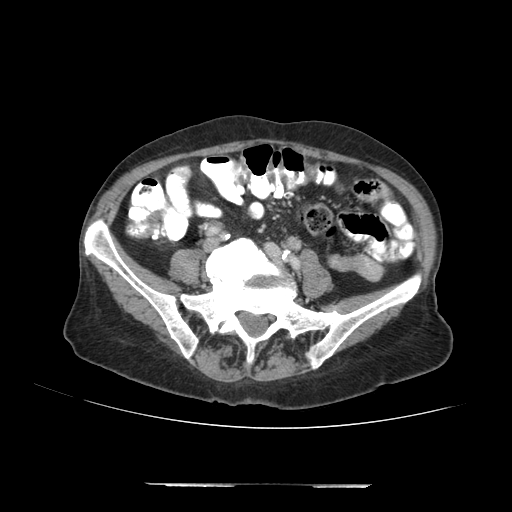
[im 86/162  soft-tissue]
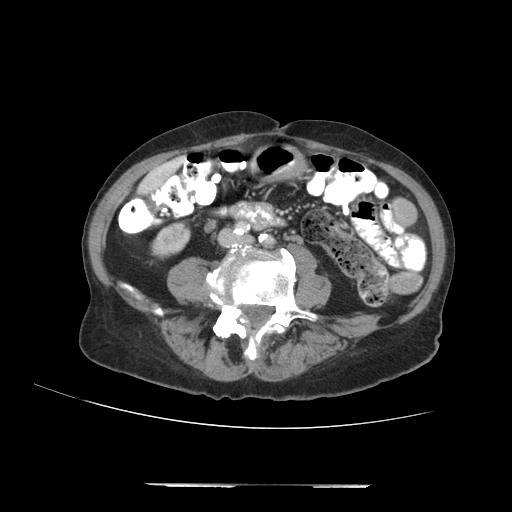
[im 105/162  soft-tissue]
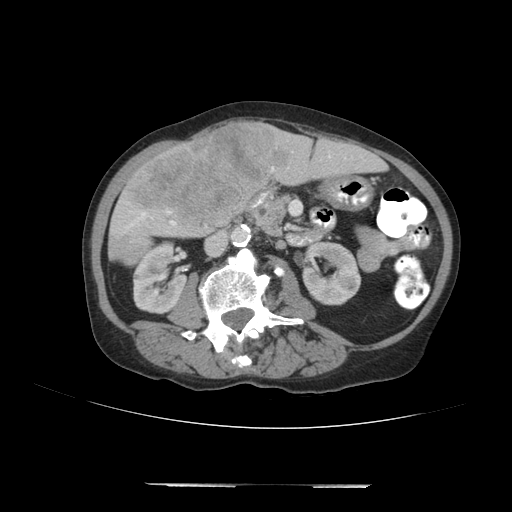
[im 124/162  soft-tissue]
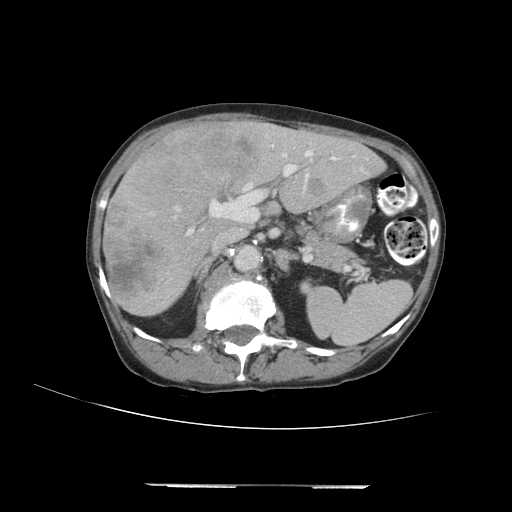
[im 124/162  lung]
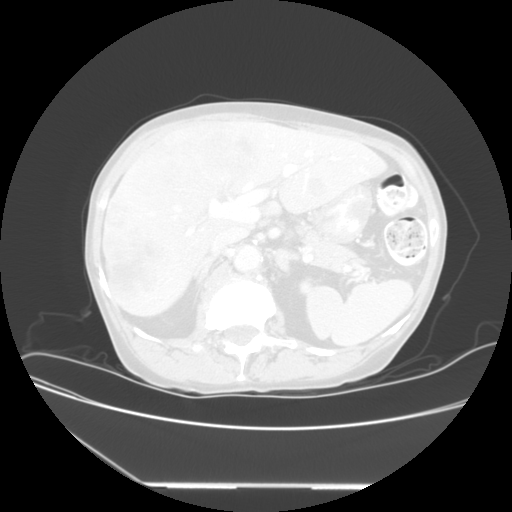
[im 133/162  soft-tissue]
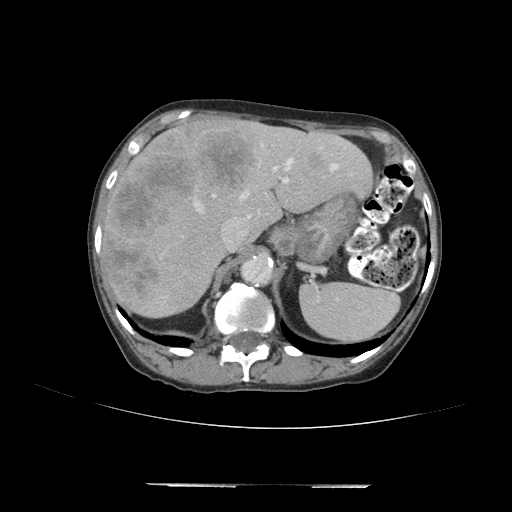
[im 133/162  lung]
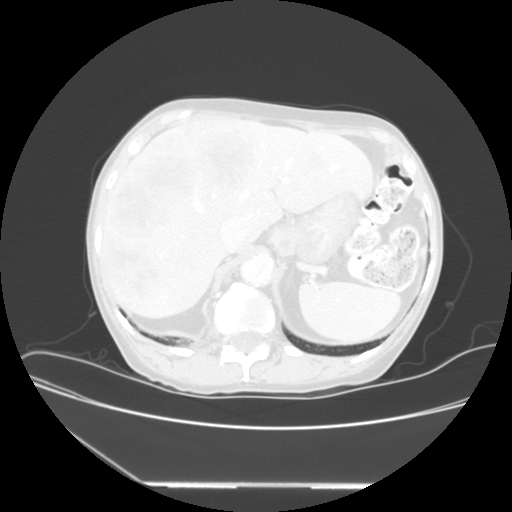
[im 133/162  bone]
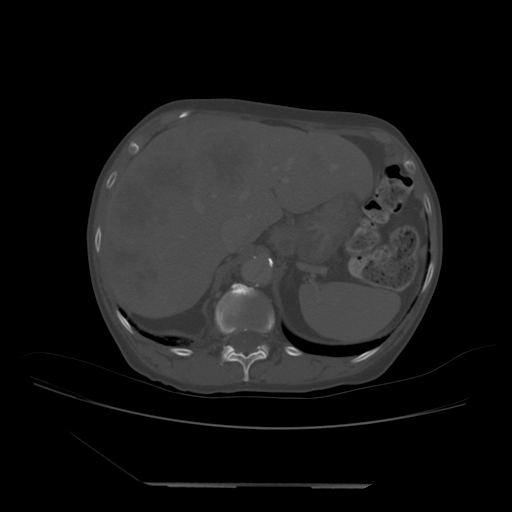
[im 143/162  lung]
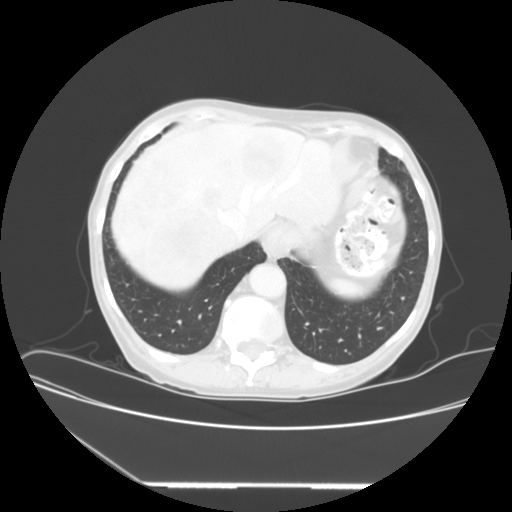
[im 152/162  soft-tissue]
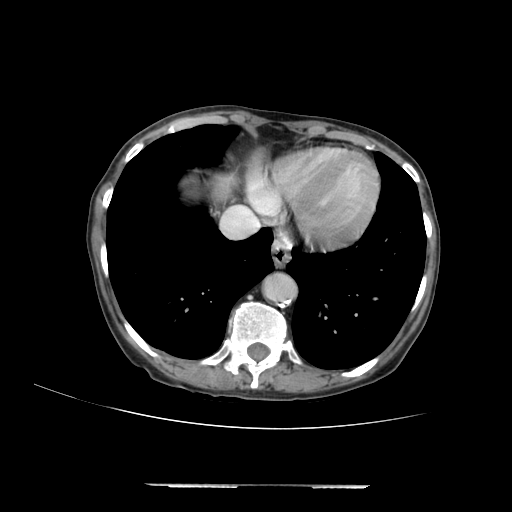
[im 152/162  lung]
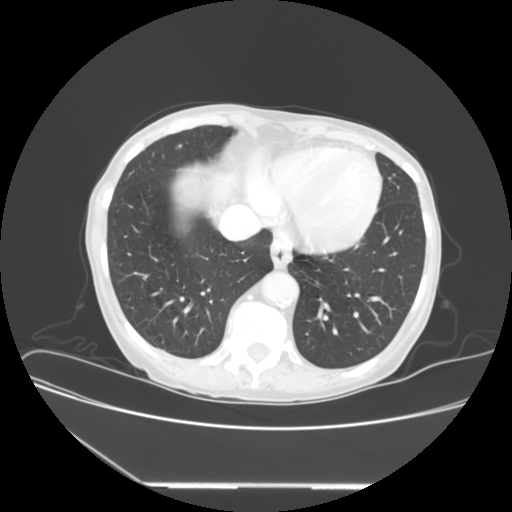

[Series 300: sagittals · sagittal · 0.81mm/px · 1 of 163 slices shown]
[im 55/163  soft-tissue]
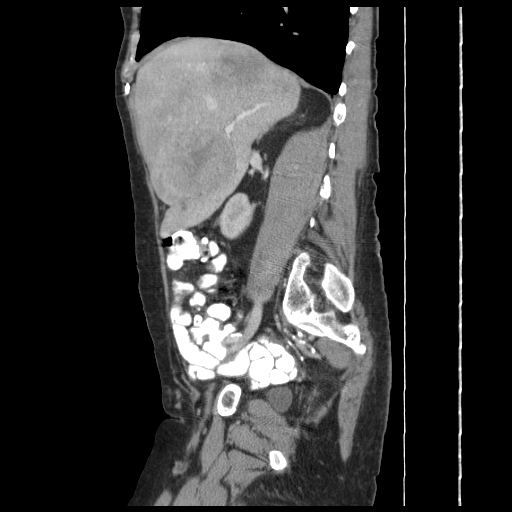

[Series 301: coronals · coronal · 0.81mm/px · 3 of 125 slices shown]
[im 42/125  soft-tissue]
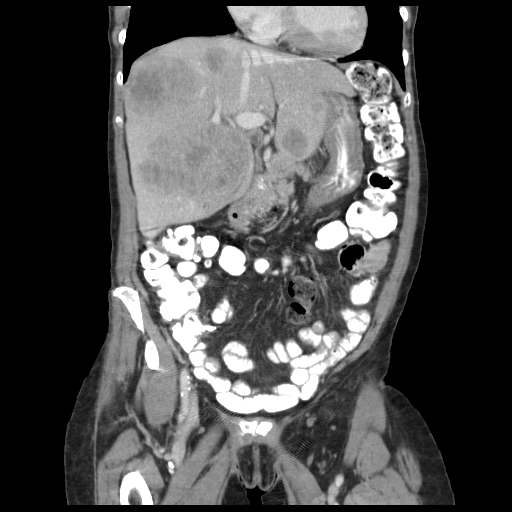
[im 56/125  soft-tissue]
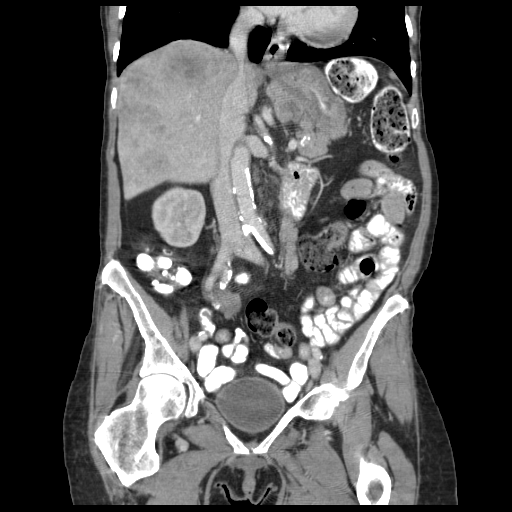
[im 69/125  soft-tissue]
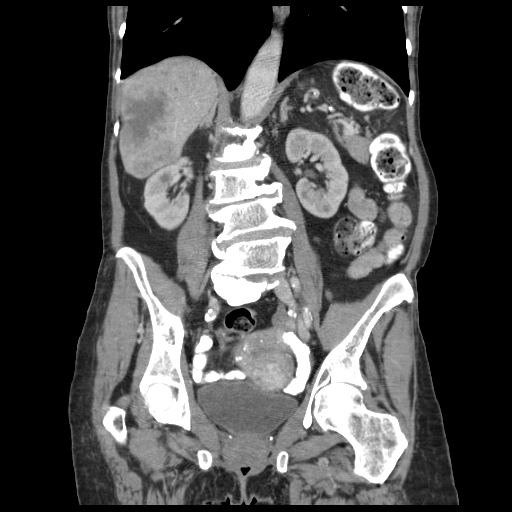

[15 of 46 positions shown; findings below may reference images not displayed]

Canned report from images found in remote index.

Refer to host system for actual result text.
